# Patient Record
Sex: Female | Born: 1985 | Race: White | Hispanic: No | Marital: Married | Smoking: Never smoker
Health system: Southern US, Community
[De-identification: ages and names within clinical notes are randomized; demographics above are authoritative.]

## PROBLEM LIST (undated history)

## (undated) DIAGNOSIS — Z87442 Personal history of urinary calculi: Secondary | ICD-10-CM

## (undated) DIAGNOSIS — R51 Headache: Secondary | ICD-10-CM

## (undated) DIAGNOSIS — K219 Gastro-esophageal reflux disease without esophagitis: Secondary | ICD-10-CM

## (undated) DIAGNOSIS — R519 Headache, unspecified: Secondary | ICD-10-CM

## (undated) HISTORY — PX: WRIST SURGERY: SHX841

## (undated) HISTORY — PX: CHOLECYSTECTOMY: SHX55

## (undated) HISTORY — PX: TUBAL LIGATION: SHX77

## (undated) HISTORY — PX: WISDOM TOOTH EXTRACTION: SHX21

---
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
## 2009-11-05 ENCOUNTER — Inpatient Hospital Stay (HOSPITAL_COMMUNITY)
Admission: AD | Admit: 2009-11-05 | Discharge: 2009-11-09 | Payer: Self-pay | Source: Home / Self Care | Admitting: Obstetrics

## 2010-02-27 ENCOUNTER — Encounter
Admission: RE | Admit: 2010-02-27 | Discharge: 2010-02-27 | Payer: Self-pay | Source: Home / Self Care | Attending: Family Medicine | Admitting: Family Medicine

## 2010-03-07 ENCOUNTER — Encounter (HOSPITAL_COMMUNITY)
Admission: RE | Admit: 2010-03-07 | Discharge: 2010-04-03 | Payer: Self-pay | Source: Home / Self Care | Attending: Family Medicine | Admitting: Family Medicine

## 2010-03-16 ENCOUNTER — Ambulatory Visit (HOSPITAL_COMMUNITY): Admission: RE | Admit: 2010-03-16 | Payer: Self-pay | Source: Home / Self Care | Admitting: Gastroenterology

## 2010-03-16 ENCOUNTER — Ambulatory Visit (HOSPITAL_COMMUNITY)
Admission: RE | Admit: 2010-03-16 | Discharge: 2010-03-16 | Payer: Self-pay | Source: Home / Self Care | Attending: Gastroenterology | Admitting: Gastroenterology

## 2010-03-29 ENCOUNTER — Encounter
Admission: RE | Admit: 2010-03-29 | Discharge: 2010-03-29 | Payer: Self-pay | Source: Home / Self Care | Attending: General Surgery | Admitting: General Surgery

## 2010-04-04 ENCOUNTER — Encounter: Payer: Self-pay | Admitting: Family Medicine

## 2010-04-27 ENCOUNTER — Other Ambulatory Visit: Payer: Self-pay | Admitting: General Surgery

## 2010-04-27 ENCOUNTER — Encounter (HOSPITAL_COMMUNITY): Payer: 59

## 2010-04-27 LAB — CBC
HCT: 41.1 % (ref 36.0–46.0)
Hemoglobin: 13.1 g/dL (ref 12.0–15.0)
MCH: 28.7 pg (ref 26.0–34.0)
MCHC: 31.9 g/dL (ref 30.0–36.0)
MCV: 90.1 fL (ref 78.0–100.0)
Platelets: 264 10*3/uL (ref 150–400)
RBC: 4.56 MIL/uL (ref 3.87–5.11)
RDW: 12.6 % (ref 11.5–15.5)
WBC: 8.6 10*3/uL (ref 4.0–10.5)

## 2010-04-27 LAB — HCG, SERUM, QUALITATIVE: Preg, Serum: NEGATIVE

## 2010-04-27 LAB — SURGICAL PCR SCREEN: Staphylococcus aureus: POSITIVE — AB

## 2010-05-01 ENCOUNTER — Other Ambulatory Visit: Payer: Self-pay | Admitting: General Surgery

## 2010-05-01 ENCOUNTER — Ambulatory Visit (HOSPITAL_COMMUNITY)
Admission: RE | Admit: 2010-05-01 | Discharge: 2010-05-01 | Disposition: A | Discharge status: Home / Self Care | Payer: 59 | Source: Ambulatory Visit | Attending: General Surgery | Admitting: General Surgery

## 2010-05-01 ENCOUNTER — Ambulatory Visit (HOSPITAL_COMMUNITY): Payer: 59

## 2010-05-01 DIAGNOSIS — E669 Obesity, unspecified: Secondary | ICD-10-CM | POA: Insufficient documentation

## 2010-05-01 DIAGNOSIS — R109 Unspecified abdominal pain: Secondary | ICD-10-CM

## 2010-05-01 DIAGNOSIS — K811 Chronic cholecystitis: Secondary | ICD-10-CM | POA: Insufficient documentation

## 2010-05-03 NOTE — Op Note (Signed)
NAMEHAVANNA, GRONER NO.:  [id number]  MEDICAL RECORD NO.:  [id number]           PATIENT TYPE:  O  LOCATION:  DAYL                         FACILITY:  [facility]  PHYSICIAN:  Adolph Pollack, M.D.DATE OF BIRTH:  1985/06/26  DATE OF PROCEDURE:  05/01/2010 DATE OF DISCHARGE:                              OPERATIVE REPORT   PREOPERATIVE DIAGNOSIS:  Chronic cholecystitis.  POSTOPERATIVE DIAGNOSIS:  Chronic cholecystitis.  PROCEDURE:  Laparoscopic cholecystectomy with intraoperative cholangiogram.  SURGEON:  Adolph Pollack, M.D.  ASSISTANT:  Sandria Bales. Ezzard Standing, M.D.  ANESTHESIA:  General.  INDICATIONS:  Ms. Cibrian is a 25 year old female who is postpartum. Two weeks after she had her cesarean section, she began noticing sharp right upper quadrant pain radiating through to the back made worse by meals.  There was intermittent nausea and vomiting associated with these events, and they have been increasing in frequency.  She underwent a thorough evaluation including an abdominal ultrasound which did not show any gallbladder disease and a nuclear medicine hepatobiliary scan showed normal gallbladder function.  She had upper endoscopy which was also unrevealing.  The CT scan of the abdomen and pelvis was unrevealing as well, and she tried proton pump inhibitors and antispasmodics without any success.  Subsequently, she elected to undergo a laparoscopic cholecystectomy.  We discussed the procedure risks including possibility of this not alleviating her symptoms.  TECHNIQUE:  She was seen in the holding area and then brought to the operating room, placed supine on the operating table and general anesthetic was administered.  The abdominal wall was sterilely prepped and draped.  Marcaine solution was infiltrated in the subumbilical region.  A small subumbilical incision was made through the skin and subcutaneous tissue.  I made an incision through the anterior  and posterior fascial layers and entered the peritoneal cavity.  A pursestring suture of 0 Vicryl was placed around the fascial edges.  A Hasson trocar was introduced into the peritoneal cavity.  The pneumoperitoneum was created by insufflation of CO2 gas.  Next, a laparoscope was introduced.  There is no underlying bleeding or organ injury.  She was then placed in the reverse Trendelenburg position with the right side tilted slightly up.  A 5-mm trocar was placed through a small epigastric incision and two 5 mm trocars were placed in the right upper quadrant.  Gallbladder was visualized and was pale and slight red consistent with some chronic inflammatory changes.  There were adhesions between the omentum and duodenum and the gallbladder and these were taken down bluntly.  The fundus of the gallbladder was grasped and retracted toward the right shoulder.  The infundibulum was grasped and then with careful dissection on the infundibulum, it was mobilized and retracted laterally.  The cystic duct was identified close to the gallbladder, and a window was created around it.  A window was also created around the cystic artery.  The critical view was achieved. A clip was placed at the cystic duct gallbladder junction.  A small incision was made in the cystic duct and some sludge was milked  from it.  Cholangiocatheter was then passed through the anterior abdominal  wall and placed into the cystic duct, and a cholangiogram was performed.  Under real time fluoroscopy, dilute contrast was injected to the cystic duct which was of moderate to long length.  Common hepatic, right left hepatic, and common bile ducts all filled promptly.  Contrast drained promptly into the duodenum without obvious evidence of obstruction. Final report is pending the radiologist interpretation.  Following this, the cholangiocatheter was removed.  The cystic duct was clipped 3 times on the biliary side and then  divided.  The cystic artery was then clipped and divided.  Using electrocautery and some blunt dissection, the gallbladder was freed up from its liver attachments. There was some leakage of bile from the gallbladder.  Once the gallbladder was freed from the liver, it was placed in the Endopouch bag.  The Endopouch bag was then removed through the subumbilical port, and the subumbilical trocar was replaced.  Following this, I copiously irrigated out the gallbladder fossa and controlled bleeding with electrocautery.  I then copiously irrigated out the perihepatic area and evacuated the fluid.  I continued irrigation until the fluid was clear.  Further inspection of the gallbladder fossa demonstrated that hemostasis was adequate and there is no evidence of bile leak.  Following this, I removed the subumbilical trocar and closed the fascial defect under laparoscopic vision by tightening up and tying down the pursestring 0 Vicryl suture.  The remaining trocars were removed and pneumoperitoneum was released.  Skin incisions were closed with 4-0 Monocryl subcuticular stitches.  Steri-Strips and sterile dressings were applied.  She tolerated the procedure without any apparent complications and was taken to recovery room in satisfactory condition.     Adolph Pollack, M.D.     Kari Baars  D:  05/01/2010  T:  05/01/2010  Job:  [id number]  cc:   Anselmo Rod, MD, [facility] Fax: [phone]  Electronically Signed by Avel Peace M.D. on 05/03/2010 10:05:06 AM

## 2010-05-17 LAB — CBC
HCT: 32.9 % — ABNORMAL LOW (ref 36.0–46.0)
HCT: 38.5 % (ref 36.0–46.0)
Hemoglobin: 11.2 g/dL — ABNORMAL LOW (ref 12.0–15.0)
Hemoglobin: 12.9 g/dL (ref 12.0–15.0)
MCH: 30.9 pg (ref 26.0–34.0)
MCH: 31.4 pg (ref 26.0–34.0)
MCHC: 33.5 g/dL (ref 30.0–36.0)
MCHC: 34.2 g/dL (ref 30.0–36.0)
MCV: 92 fL (ref 78.0–100.0)
MCV: 92.3 fL (ref 78.0–100.0)
Platelets: 214 10*3/uL (ref 150–400)
Platelets: 277 10*3/uL (ref 150–400)
RBC: 3.58 MIL/uL — ABNORMAL LOW (ref 3.87–5.11)
RBC: 4.17 MIL/uL (ref 3.87–5.11)
RDW: 13.8 % (ref 11.5–15.5)
RDW: 13.9 % (ref 11.5–15.5)
WBC: 13.7 10*3/uL — ABNORMAL HIGH (ref 4.0–10.5)
WBC: 18.2 10*3/uL — ABNORMAL HIGH (ref 4.0–10.5)

## 2010-05-17 LAB — TYPE AND SCREEN
ABO/RH(D): A NEG
Antibody Screen: NEGATIVE

## 2010-05-17 LAB — ABO/RH: ABO/RH(D): A NEG

## 2010-05-17 LAB — RPR: RPR Ser Ql: NONREACTIVE

## 2010-07-17 NOTE — Op Note (Signed)
Grace Moore, Grace Moore              ACCOUNT NO.:  [id number]   MEDICAL RECORD NO.:  [id number]          PATIENT TYPE:  AMB   LOCATION:  DSC                          FACILITY:  [facility]   PHYSICIAN:  Feliberto Gottron. Turner Daniels, M.D.   DATE OF BIRTH:  1985/12/21   DATE OF PROCEDURE:  10/01/2006  DATE OF DISCHARGE:                               OPERATIVE REPORT   PREOPERATIVE DIAGNOSIS:  Right de Quervain's stenosing tenosynovitis.   POSTOPERATIVE DIAGNOSIS:  Right de Quervain's stenosing tenosynovitis.   PROCEDURE:  Right de Quervain's stenosing tenosynovitis release.   SURGEON:  Feliberto Gottron. Turner Daniels, M.D.   FIRST ASSISTANT:  None.   ANESTHETIC:  General LMA.   ESTIMATED BLOOD LOSS:  Minimal.   FLUID REPLACEMENT:  500 mL crystalloid.   DRAINS PLACED:  None.   TOURNIQUET TIME:  12 minutes.   INDICATIONS FOR PROCEDURE:  A 25 year old woman who underwent a left de  Quervain's release by me a few weeks ago, has done very well and desires  same on the right side where she has had de Quervain's disease for a  number of months now that has been refractory to conservative treatment.  Risks and benefits of surgery well-known to the patient, questions  answered.   DESCRIPTION OF PROCEDURE:  The patient identified by armband, taken to  the operating room at [facility] day surgery center where the appropriate  anesthetic monitors were attached and general LMA anesthesia induced  with the patient in supine position.  Tourniquet applied high to the  right forearm and right upper extremity prepped and draped in usual  sterile fashion from the fingertips to the tourniquet.  The limb was  wrapped in Esmarch bandage, tourniquet inflated to 300 mmHg and we made  a longitudinal incision over the first dorsal compartment of the right  wrist just through the skin and into the subcutaneous tissue.  Small  bleeders were identified and cauterized with bipolar.  A major branch of  the superficial radial nerve was identified  and gently retracted  dorsally and this exposed the first dorsal compartment tendon sheath.  This was incised and a rectangular section removed, freeing the abductor  pollicis longus tendon.  The EPL tendon was not seen.  It was in a  second sheath behind the APL and this was also opened, releasing the  extensor pollicis brevis tendon.  Satisfied with release, the wound was  then irrigated  out with normal saline solution.  We looked again for any small bleeders  and then the skin only was closed with running 5-0 nylon suture.  A  dressing of Xeroform, 4x4 dressing sponges, Webril and Ace wrap applied.  Tourniquet let down.  The patient awakened and taken to recovery room in  a sling.      Feliberto Gottron. Turner Daniels, M.D.  Electronically Signed     FJR/MEDQ  D:  10/01/2006  T:  10/02/2006  Job:  [id number]

## 2010-07-20 NOTE — Op Note (Signed)
NAMEMANILA, ROMMEL              ACCOUNT NO.:  [id number]   MEDICAL RECORD NO.:  [id number]          PATIENT TYPE:  AMB   LOCATION:  DSC                          FACILITY:  [facility]   PHYSICIAN:  Feliberto Gottron. Turner Daniels, M.D.   DATE OF BIRTH:  Oct 09, 1985   DATE OF PROCEDURE:  07/14/2006  DATE OF DISCHARGE:  07/14/2006                               OPERATIVE REPORT   PREOPERATIVE DIAGNOSIS:  Left wrist de Quervain's stenosing  tenosynovitis.   POSTOPERATIVE DIAGNOSIS:  Left wrist de Quervain's stenosing  tenosynovitis.   PROCEDURE:  Left wrist de Quervain's release.   SURGEON:  Feliberto Gottron. Turner Daniels, M.D.   FIRST ASSISTANT:  None.   ANESTHETIC:  General LMA.   ESTIMATED BLOOD LOSS:  Minimal.   FLUID REPLACEMENT:  500 mL of crystalloid.   DRAINS PLACED:  None.   TOURNIQUET TIME:  Fifteen minutes.   INDICATIONS FOR PROCEDURE:  [age]-year-old young woman with  symptomatic left wrist de Quervain's stenosing tenosynovitis previously  treated by one of my partners, Dr. Althea Charon, who now presents for  surgical decompression of same, having failed conservative measures.  Risks and benefits of surgery were discussed, questions answered.   DESCRIPTION OF PROCEDURE:  The patient was identified by armband and  taken to the operating room at [facility].  Appropriate  site monitors were attached and general LMA anesthesia induced with the  patient in supine position.  Tourniquet was then applied to the forearm  and the left upper extremity prepped and draped in usual sterile fashion  from the fingertips to the tourniquet.  We began the procedure by making  a 1-cm incision, longitudinal, over the 1st dorsal compartment of the  left wrist through the skin and just into the subcutaneous tissue,  spread with tenotomy scissors and identified the 1st dorsal compartment.  A rectangular section of the pulley was removed, exposing the abductor  pollicis longus tendon, which was freed, and the  abductor pollicis  longus tendon and the extensor pollicis brevis tendon.  Both tendons  were freed up.  The wound was irrigated out with normal  saline solution and then carefully closed with a running 5-0 nylon  suture.  A dressing of Xeroform, 4 x 4 dressing sponges x2, two-inch  Webril and a 2-inch Ace wrap were then applied.  The tourniquet was let  down, the patient awakened and taken to the recovery room without  difficulty.      Feliberto Gottron. Turner Daniels, M.D.  Electronically Signed     FJR/MEDQ  D:  08/11/2006  T:  08/12/2006  Job:  [id number]

## 2010-07-20 NOTE — Op Note (Signed)
NAME:  Grace Moore, Grace Moore                        ACCOUNT NO.:  [id number]   MEDICAL RECORD NO.:  [id number]                   PATIENT TYPE:  AMB   LOCATION:  ENDO                                 FACILITY:  [facility]   PHYSICIAN:  James L. Malon Kindle., M.D.          DATE OF BIRTH:  06/28/1985   DATE OF PROCEDURE:  03/07/2003  DATE OF DISCHARGE:                                 OPERATIVE REPORT   PROCEDURE PERFORMED:  Esophagogastroduodenoscopy.   MEDICATIONS:  Fentanyl 50 mcg, Versed 7 mg IV, Cetacaine spray.   ENDOSCOPIST:  Llana Aliment. Randa Evens, M.D.   INDICATIONS FOR PROCEDURE:  Persistent dyspepsia.   DESCRIPTION OF PROCEDURE:  The procedure had been explained to the patient  and consent obtained.  With the patient in left lateral decubitus position,  the Olympus scope was inserted and advanced under direct visualization.  The  stomach was entered.  The pylorus identified and passed.  The duodenum  including the bulb and second portion were seen well and were unremarkable.  The scope was withdrawn.  The duodenum, pyloric channel and antrum were  normal.  The fundus and cardia were seen well on the retroflex view and were  normal.  The gastroesophageal junction was widely patent with a slightly  reddened esophagus and a small hiatal hernia, no evidence of Barrett's  esophagus.  The proximal esophagus was seen well and was normal.  The scope  was withdrawn.  The patient tolerated the procedure well.   ASSESSMENT:  Gastroesophageal reflux disease.  Not clear if this is causing  all of her problems.   PLAN:  Will give her reflux sheet, continue on the Aciphex twice daily.  See  back in the office in six to eight weeks.  Will go ahead and give her a  trial of Robinul to see if irritable bowel may be a factor in this.                                               James L. Malon Kindle., M.D.    Waldron Session  D:  03/07/2003  T:  03/07/2003  Job:  [id number]   cc:   Meredith Staggers, M.D.  [address]  [place]  [place] [address]  Fax: [phone]

## 2010-10-23 ENCOUNTER — Other Ambulatory Visit (HOSPITAL_COMMUNITY): Payer: Self-pay | Admitting: Family Medicine

## 2010-10-23 DIAGNOSIS — R609 Edema, unspecified: Secondary | ICD-10-CM

## 2010-10-23 DIAGNOSIS — R52 Pain, unspecified: Secondary | ICD-10-CM

## 2010-11-06 ENCOUNTER — Ambulatory Visit (HOSPITAL_COMMUNITY): Payer: Commercial Managed Care - PPO

## 2010-11-07 ENCOUNTER — Ambulatory Visit (HOSPITAL_COMMUNITY)
Admission: RE | Admit: 2010-11-07 | Discharge: 2010-11-07 | Disposition: A | Discharge status: Home / Self Care | Payer: 59 | Source: Ambulatory Visit | Attending: Family Medicine | Admitting: Family Medicine

## 2010-11-07 DIAGNOSIS — M65979 Unspecified synovitis and tenosynovitis, unspecified ankle and foot: Secondary | ICD-10-CM | POA: Insufficient documentation

## 2010-11-07 DIAGNOSIS — M25476 Effusion, unspecified foot: Secondary | ICD-10-CM | POA: Insufficient documentation

## 2010-11-07 DIAGNOSIS — R609 Edema, unspecified: Secondary | ICD-10-CM

## 2010-11-07 DIAGNOSIS — M25473 Effusion, unspecified ankle: Secondary | ICD-10-CM | POA: Insufficient documentation

## 2010-11-07 DIAGNOSIS — M25579 Pain in unspecified ankle and joints of unspecified foot: Secondary | ICD-10-CM | POA: Insufficient documentation

## 2010-11-07 DIAGNOSIS — R52 Pain, unspecified: Secondary | ICD-10-CM

## 2010-11-07 DIAGNOSIS — M659 Synovitis and tenosynovitis, unspecified: Secondary | ICD-10-CM | POA: Insufficient documentation

## 2010-12-17 LAB — POCT HEMOGLOBIN-HEMACUE
Hemoglobin: 14.2
Operator id: 116011

## 2011-07-15 IMAGING — CT CT ABD-PELV W/ CM
3 of 4 series · 13 of 36 positions shown, 19 images · IV contrast (OMNI 300, WATER & [ID] OMNI 300)
Comparison: none

CLINICAL DATA: Right upper quadrant abdominal pain

CT ABDOMEN AND PELVIS WITH CONTRAST
TECHNIQUE: Multidetector CT imaging of the abdomen and pelvis was
performed following the standard protocol during bolus
administration of intravenous contrast.
Contrast: 125ml omni 300

[Series 3: routine abdomen · axial · 0.78mm/px · z∈[-310,+20]mm · 8 of 86 slices shown, 13 images]
[im 10/86  soft-tissue]
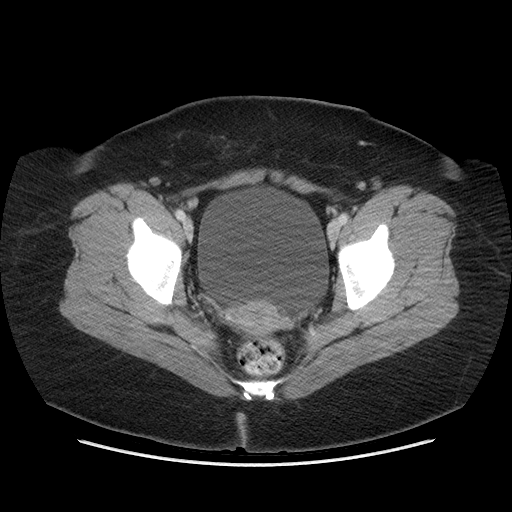
[im 10/86  bone]
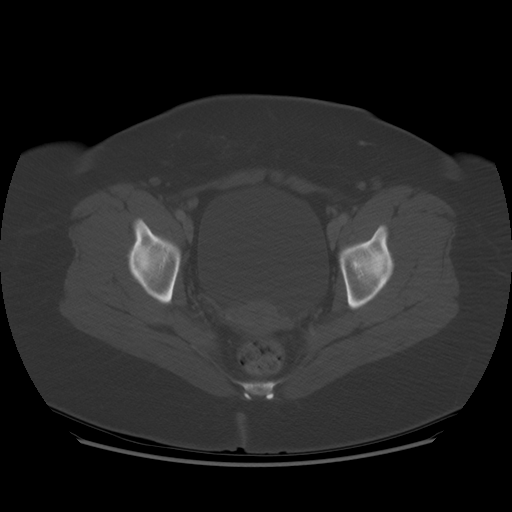
[im 19/86  soft-tissue]
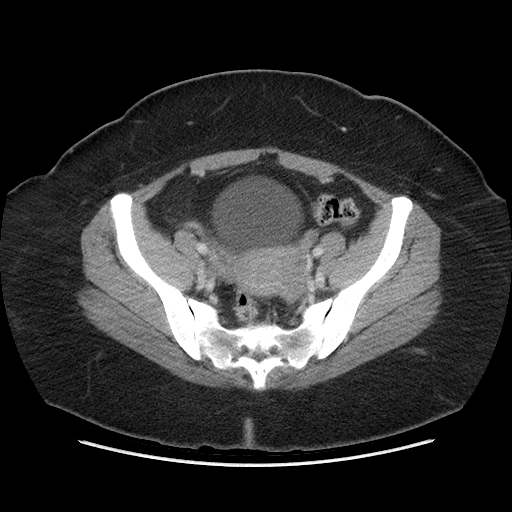
[im 29/86  soft-tissue]
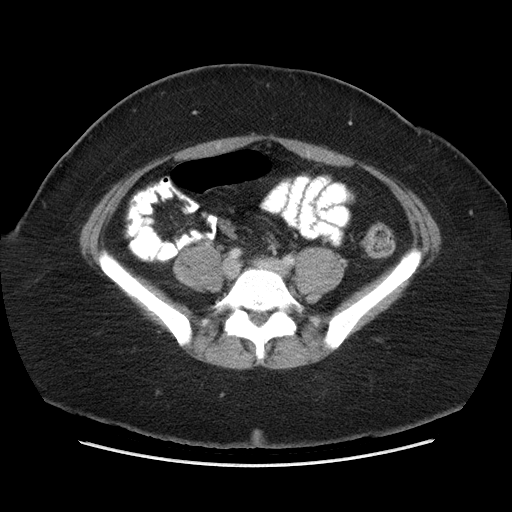
[im 38/86  soft-tissue]
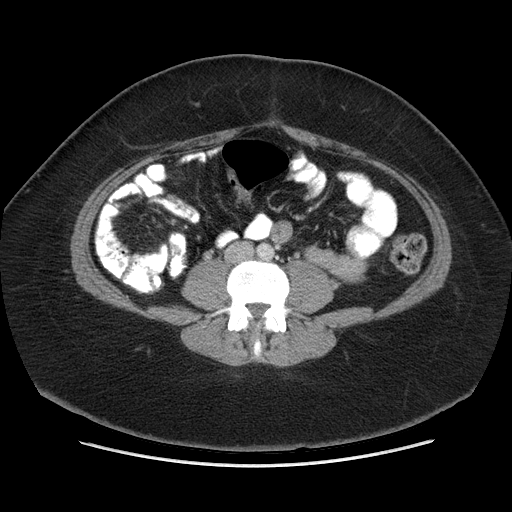
[im 48/86  soft-tissue]
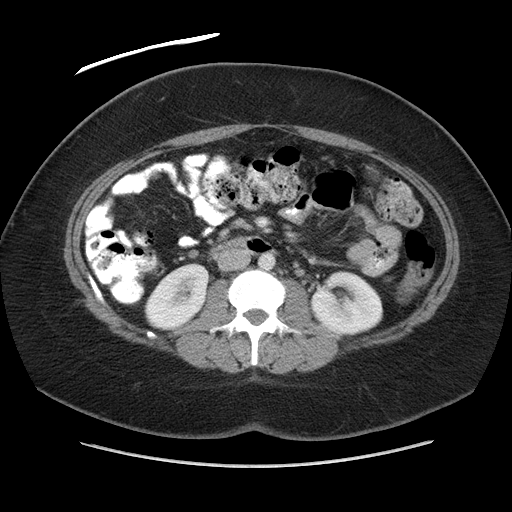
[im 48/86  lung]
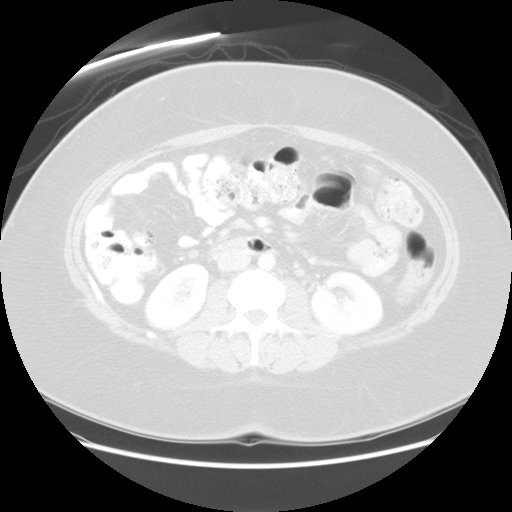
[im 57/86  soft-tissue]
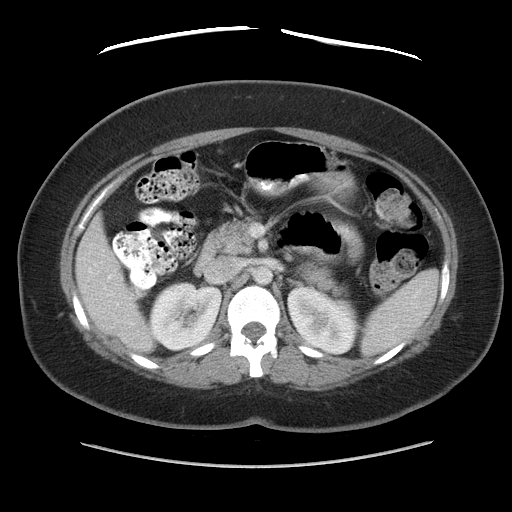
[im 57/86  lung]
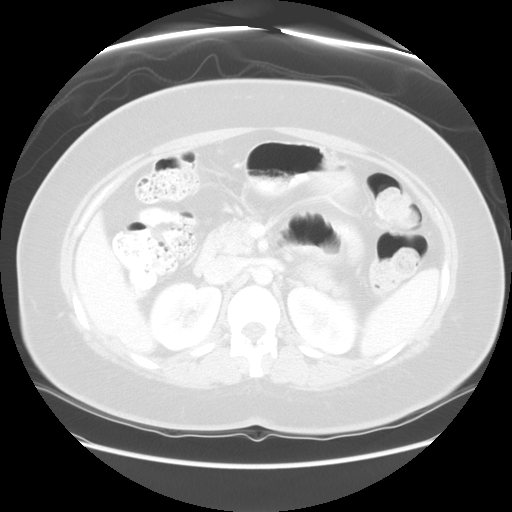
[im 67/86  soft-tissue]
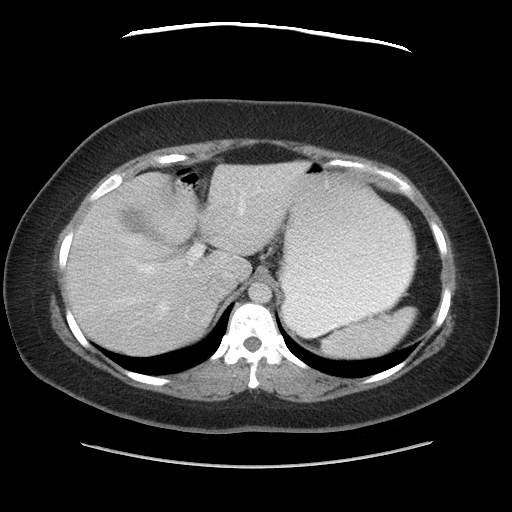
[im 67/86  lung]
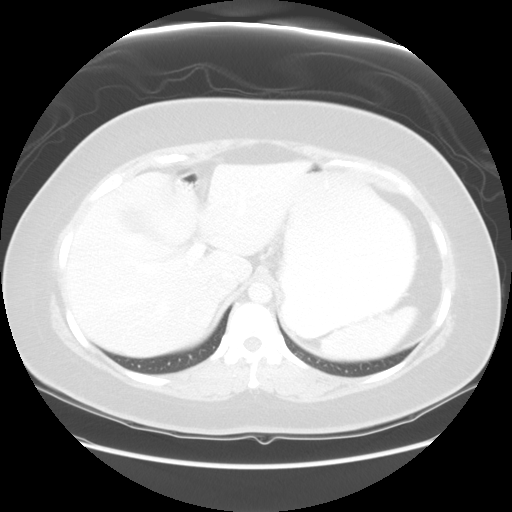
[im 76/86  soft-tissue]
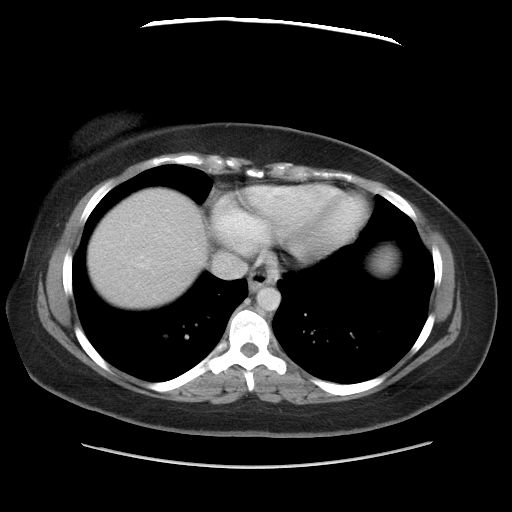
[im 76/86  lung]
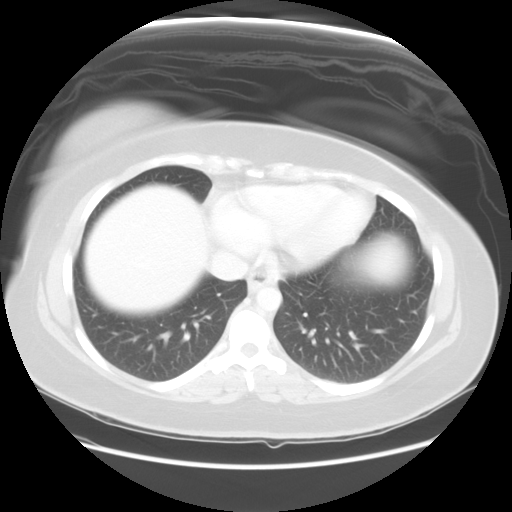

[Series 601: coronal body · coronal · 0.94mm/px · 1 of 127 slices shown, 2 images]
[im 43/127  soft-tissue]
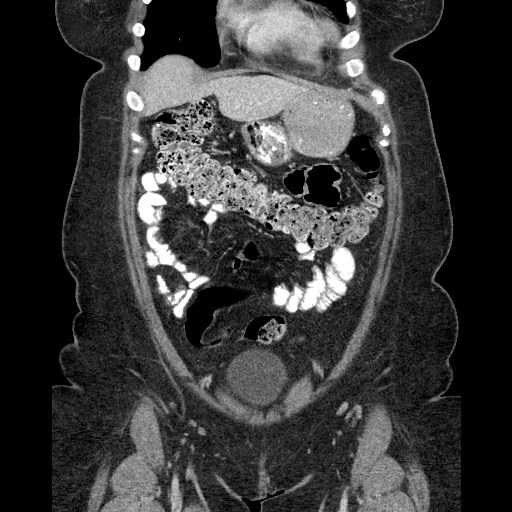
[im 43/127  bone]
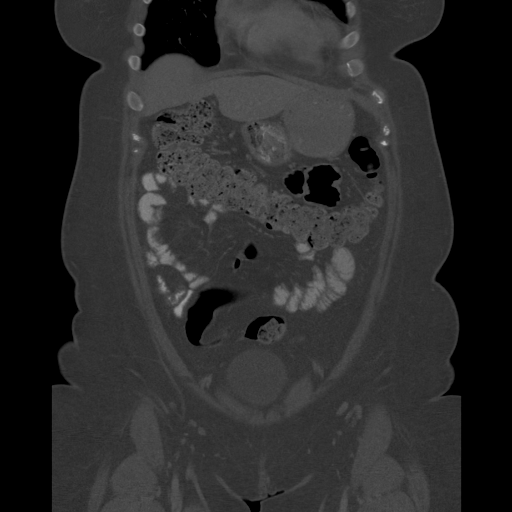

[Series 602: sagittal body · sagittal · 0.94mm/px · 4 of 161 slices shown]
[im 18/161  soft-tissue]
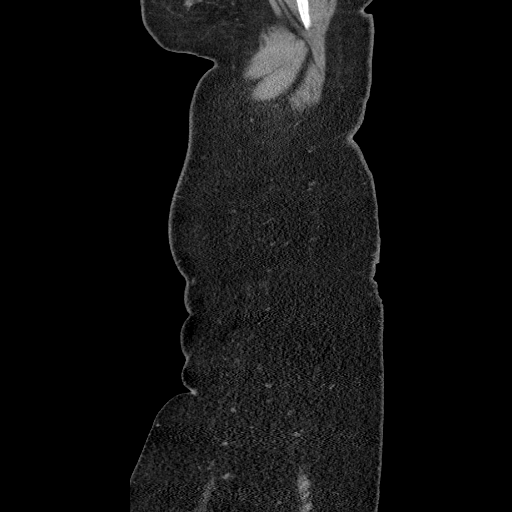
[im 36/161  soft-tissue]
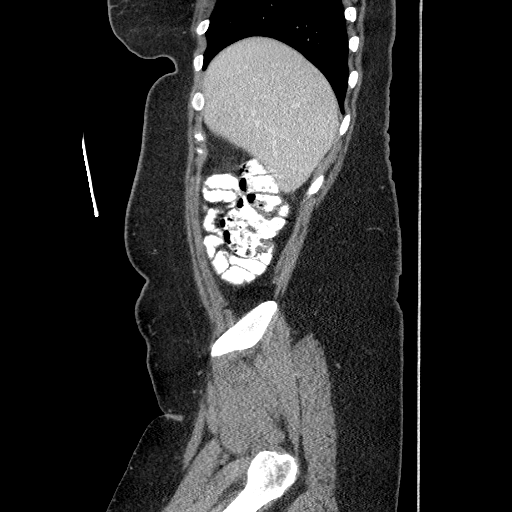
[im 54/161  soft-tissue]
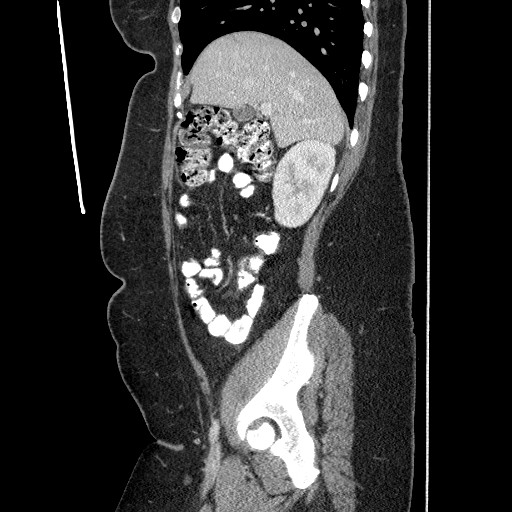
[im 72/161  soft-tissue]
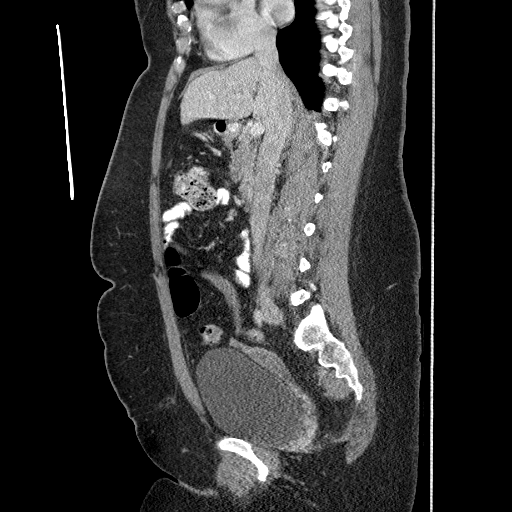

[13 of 36 positions shown; findings below may reference images not displayed]

FINDINGS: The lung bases appear clear.  The spleen appears normal.

Both adrenal glands appear normal.

The pancreas appears normal.

No focal liver abnormalities identified.

The gallbladder is unremarkable.  No biliary dilatation.

Normal kidneys.

There are no enlarged lymph nodes within the upper abdomen or
pelvis.

No inguinal adenopathy.  The urinary bladder appears normal.

There is an IUD within the uterine cavity.  The uterus and adnexal
structures have an otherwise physiologic appearance

The stomach and the small bowel loops have a normal course and
caliber.

The colon appears normal.

The appendix is negative.

Review of the visualized osseous structures is unremarkable.
IMPRESSION: 1.  No acute findings within the abdomen or pelvis.

## 2012-06-02 ENCOUNTER — Ambulatory Visit (INDEPENDENT_AMBULATORY_CARE_PROVIDER_SITE_OTHER): Payer: 59 | Admitting: Family Medicine

## 2012-06-02 DIAGNOSIS — Z3049 Encounter for surveillance of other contraceptives: Secondary | ICD-10-CM

## 2012-06-02 DIAGNOSIS — Z3042 Encounter for surveillance of injectable contraceptive: Secondary | ICD-10-CM

## 2012-06-02 MED ORDER — MEDROXYPROGESTERONE ACETATE 150 MG/ML IM SUSP
150.0000 mg | Freq: Once | INTRAMUSCULAR | Status: AC
  Administered 2012-06-02: 150 mg via INTRAMUSCULAR

## 2012-08-26 ENCOUNTER — Ambulatory Visit (INDEPENDENT_AMBULATORY_CARE_PROVIDER_SITE_OTHER): Payer: 59 | Admitting: Family Medicine

## 2012-08-26 DIAGNOSIS — Z309 Encounter for contraceptive management, unspecified: Secondary | ICD-10-CM

## 2012-08-26 MED ORDER — MEDROXYPROGESTERONE ACETATE 150 MG/ML IM SUSP
150.0000 mg | Freq: Once | INTRAMUSCULAR | Status: AC
  Administered 2012-08-26: 150 mg via INTRAMUSCULAR

## 2012-08-31 ENCOUNTER — Encounter: Payer: Self-pay | Admitting: Physician Assistant

## 2012-11-06 ENCOUNTER — Ambulatory Visit: Payer: 59 | Admitting: Family Medicine

## 2012-11-06 DIAGNOSIS — Z23 Encounter for immunization: Secondary | ICD-10-CM

## 2012-11-13 ENCOUNTER — Other Ambulatory Visit: Payer: Self-pay | Admitting: Family Medicine

## 2012-11-13 ENCOUNTER — Other Ambulatory Visit: Payer: 59

## 2012-11-13 DIAGNOSIS — E559 Vitamin D deficiency, unspecified: Secondary | ICD-10-CM

## 2012-11-14 LAB — VITAMIN D 25 HYDROXY (VIT D DEFICIENCY, FRACTURES): Vit D, 25-Hydroxy: 55 ng/mL (ref 30–89)

## 2012-11-20 ENCOUNTER — Ambulatory Visit (INDEPENDENT_AMBULATORY_CARE_PROVIDER_SITE_OTHER): Payer: 59 | Admitting: *Deleted

## 2012-11-20 DIAGNOSIS — Z3042 Encounter for surveillance of injectable contraceptive: Secondary | ICD-10-CM

## 2012-11-20 DIAGNOSIS — Z3049 Encounter for surveillance of other contraceptives: Secondary | ICD-10-CM

## 2012-11-20 MED ORDER — MEDROXYPROGESTERONE ACETATE 150 MG/ML IM SUSP
150.0000 mg | Freq: Once | INTRAMUSCULAR | Status: AC
  Administered 2012-11-20: 150 mg via INTRAMUSCULAR

## 2012-11-30 ENCOUNTER — Encounter: Payer: Self-pay | Admitting: Physician Assistant

## 2012-11-30 ENCOUNTER — Ambulatory Visit (INDEPENDENT_AMBULATORY_CARE_PROVIDER_SITE_OTHER): Payer: 59 | Admitting: Physician Assistant

## 2012-11-30 VITALS — BP 116/80 | HR 92 | Temp 98.8°F | Resp 18

## 2012-11-30 DIAGNOSIS — A499 Bacterial infection, unspecified: Secondary | ICD-10-CM

## 2012-11-30 DIAGNOSIS — B9689 Other specified bacterial agents as the cause of diseases classified elsewhere: Secondary | ICD-10-CM

## 2012-11-30 DIAGNOSIS — J069 Acute upper respiratory infection, unspecified: Secondary | ICD-10-CM

## 2012-11-30 MED ORDER — AMOXICILLIN-POT CLAVULANATE 875-125 MG PO TABS: 1.0000 | ORAL_TABLET | Freq: Two times a day (BID) | ORAL | Status: DC

## 2012-11-30 NOTE — Progress Notes (Signed)
   Patient ID: SPENCER PETERKIN MRN: [id number], DOB: 26-Nov-1985, 27 y.o. Date of Encounter: 11/30/2012, 4:06 PM    Chief Complaint:  Chief Complaint  Patient presents with  . c/o congestion,cough, sneezing     HPI: 27 y.o. year old white female reports that she has been sick for about 2 weeks. She has had a lot of head and nasal congestion and sinus pain and pressure. Also mucus from her nose. Her throat was sore in the beginning but has not been very sore recently. However she has had some hoarseness off and on. No chest congestion. Has multiple family members have been sick. Her daughter has ear infections.  Home Meds: See attached medication section for any medications that were entered at today's visit. The computer does not put those onto this list.The following list is a list of meds entered prior to today's visit.   No current outpatient prescriptions on file prior to visit.   No current facility-administered medications on file prior to visit.    Allergies: No Known Allergies    Review of Systems: See HPI for pertinent ROS. All other ROS negative.    Physical Exam: Blood pressure 116/80, pulse 92, temperature 98.8 F (37.1 C), temperature source Oral, resp. rate 18., There is no height or weight on file to calculate BMI. General:  Appears in no acute distress. HEENT: Normocephalic, atraumatic, eyes without discharge, sclera non-icteric, nares are without discharge. Bilateral auditory canals clear, TM's are without perforation, pearly grey and translucent with reflective cone of light bilaterally. Oral cavity moist, posterior pharynx without exudate, erythema, peritonsillar abscess. Bilateral maxillary sinuses are tender.  Neck: Supple. No thyromegaly. No lymphadenopathy. Lungs: Clear bilaterally to auscultation without wheezes, rales, or rhonchi. Breathing is unlabored. Heart: Regular rhythm. No murmurs, rubs, or gallops. Msk:  Strength and tone normal for  age. Extremities/Skin: Warm and dry. Neuro: Alert and oriented X 3. Moves all extremities spontaneously. Gait is normal. CNII-XII grossly in tact. Psych:  Responds to questions appropriately with a normal affect.     ASSESSMENT AND PLAN:  27 y.o. year old female with  1. Bacterial upper respiratory infection - amoxicillin-clavulanate (AUGMENTIN) 875-125 MG per tablet; Take 1 tablet by mouth 2 (two) times daily.  Dispense: 20 tablet; Refill: 0 Also can use over-the-counter decongestants as needed. Followup if does not resolve with completion of antibiotics.  [address] [place], [place], [facility] 11/30/2012 4:06 PM

## 2013-09-26 ENCOUNTER — Encounter (HOSPITAL_BASED_OUTPATIENT_CLINIC_OR_DEPARTMENT_OTHER): Payer: Self-pay | Admitting: Emergency Medicine

## 2013-09-26 ENCOUNTER — Emergency Department (HOSPITAL_BASED_OUTPATIENT_CLINIC_OR_DEPARTMENT_OTHER): Payer: 59

## 2013-09-26 ENCOUNTER — Emergency Department (HOSPITAL_BASED_OUTPATIENT_CLINIC_OR_DEPARTMENT_OTHER)
Admission: EM | Admit: 2013-09-26 | Discharge: 2013-09-26 | Disposition: A | Discharge status: Home / Self Care | Payer: 59 | Attending: Emergency Medicine | Admitting: Emergency Medicine

## 2013-09-26 DIAGNOSIS — K5900 Constipation, unspecified: Secondary | ICD-10-CM | POA: Diagnosis not present

## 2013-09-26 DIAGNOSIS — R109 Unspecified abdominal pain: Secondary | ICD-10-CM | POA: Insufficient documentation

## 2013-09-26 DIAGNOSIS — R300 Dysuria: Secondary | ICD-10-CM | POA: Insufficient documentation

## 2013-09-26 DIAGNOSIS — G8929 Other chronic pain: Secondary | ICD-10-CM | POA: Diagnosis not present

## 2013-09-26 DIAGNOSIS — Z3202 Encounter for pregnancy test, result negative: Secondary | ICD-10-CM | POA: Diagnosis not present

## 2013-09-26 DIAGNOSIS — M549 Dorsalgia, unspecified: Secondary | ICD-10-CM | POA: Diagnosis not present

## 2013-09-26 DIAGNOSIS — Z79899 Other long term (current) drug therapy: Secondary | ICD-10-CM | POA: Insufficient documentation

## 2013-09-26 LAB — CBC WITH DIFFERENTIAL/PLATELET
BASOS ABS: 0 10*3/uL (ref 0.0–0.1)
BASOS PCT: 0 % (ref 0–1)
Eosinophils Absolute: 0.1 10*3/uL (ref 0.0–0.7)
Eosinophils Relative: 1 % (ref 0–5)
HCT: 39.5 % (ref 36.0–46.0)
Hemoglobin: 13.1 g/dL (ref 12.0–15.0)
Lymphocytes Relative: 27 % (ref 12–46)
Lymphs Abs: 2.4 10*3/uL (ref 0.7–4.0)
MCH: 30.3 pg (ref 26.0–34.0)
MCHC: 33.2 g/dL (ref 30.0–36.0)
MCV: 91.4 fL (ref 78.0–100.0)
MONO ABS: 0.9 10*3/uL (ref 0.1–1.0)
Monocytes Relative: 10 % (ref 3–12)
NEUTROS ABS: 5.7 10*3/uL (ref 1.7–7.7)
Neutrophils Relative %: 63 % (ref 43–77)
PLATELETS: 277 10*3/uL (ref 150–400)
RBC: 4.32 MIL/uL (ref 3.87–5.11)
RDW: 12.7 % (ref 11.5–15.5)
WBC: 9.1 10*3/uL (ref 4.0–10.5)

## 2013-09-26 LAB — PREGNANCY, URINE: PREG TEST UR: NEGATIVE

## 2013-09-26 LAB — URINALYSIS, ROUTINE W REFLEX MICROSCOPIC
Bilirubin Urine: NEGATIVE
GLUCOSE, UA: NEGATIVE mg/dL
Hgb urine dipstick: NEGATIVE
KETONES UR: NEGATIVE mg/dL
LEUKOCYTES UA: NEGATIVE
NITRITE: NEGATIVE
PROTEIN: NEGATIVE mg/dL
Specific Gravity, Urine: 1.018 (ref 1.005–1.030)
UROBILINOGEN UA: 1 mg/dL (ref 0.0–1.0)
pH: 8 (ref 5.0–8.0)

## 2013-09-26 LAB — COMPREHENSIVE METABOLIC PANEL
ALBUMIN: 3.6 g/dL (ref 3.5–5.2)
ALT: 65 U/L — ABNORMAL HIGH (ref 0–35)
AST: 42 U/L — ABNORMAL HIGH (ref 0–37)
Alkaline Phosphatase: 76 U/L (ref 39–117)
Anion gap: 11 (ref 5–15)
BUN: 11 mg/dL (ref 6–23)
CHLORIDE: 104 meq/L (ref 96–112)
CO2: 26 mEq/L (ref 19–32)
CREATININE: 0.7 mg/dL (ref 0.50–1.10)
Calcium: 9.1 mg/dL (ref 8.4–10.5)
GFR calc Af Amer: >90 mL/min (ref >90)
GFR calc non Af Amer: >90 mL/min (ref >90)
Glucose, Bld: 103 mg/dL — ABNORMAL HIGH (ref 70–99)
Potassium: 4.1 mEq/L (ref 3.7–5.3)
Sodium: 141 mEq/L (ref 137–147)
Total Bilirubin: 0.5 mg/dL (ref 0.3–1.2)
Total Protein: 7.2 g/dL (ref 6.0–8.3)

## 2013-09-26 MED ORDER — MORPHINE SULFATE 4 MG/ML IJ SOLN
4.0000 mg | Freq: Once | INTRAMUSCULAR | Status: AC
  Administered 2013-09-26: 4 mg via INTRAVENOUS
  Filled 2013-09-26: qty 1

## 2013-09-26 MED ORDER — POLYETHYLENE GLYCOL 3350 17 G PO PACK: 17.0000 g | PACK | Freq: Every day | ORAL | Status: DC

## 2013-09-26 MED ORDER — FLEET ENEMA 7-19 GM/118ML RE ENEM: 1.0000 | ENEMA | Freq: Every day | RECTAL | Status: DC | PRN

## 2013-09-26 MED ORDER — SODIUM CHLORIDE 0.9 % IV SOLN
Freq: Once | INTRAVENOUS | Status: AC
  Administered 2013-09-26: 10 mL/h via INTRAVENOUS

## 2013-09-26 MED ORDER — MAGNESIUM CITRATE PO SOLN: 1.0000 | Freq: Once | ORAL | Status: DC

## 2013-09-26 MED ORDER — IOHEXOL 300 MG/ML  SOLN
100.0000 mL | Freq: Once | INTRAMUSCULAR | Status: AC | PRN
Start: 2013-09-26 — End: 2013-09-26
  Administered 2013-09-26: 100 mL via INTRAVENOUS

## 2013-09-26 MED ORDER — HYDROMORPHONE HCL PF 1 MG/ML IJ SOLN
1.0000 mg | Freq: Once | INTRAMUSCULAR | Status: AC
  Administered 2013-09-26: 1 mg via INTRAVENOUS
  Filled 2013-09-26: qty 1

## 2013-09-26 MED ORDER — ONDANSETRON HCL 4 MG/2ML IJ SOLN
4.0000 mg | Freq: Once | INTRAMUSCULAR | Status: AC
  Administered 2013-09-26: 4 mg via INTRAVENOUS
  Filled 2013-09-26: qty 2

## 2013-09-26 MED ORDER — IOHEXOL 300 MG/ML  SOLN
50.0000 mL | Freq: Once | INTRAMUSCULAR | Status: AC | PRN
  Administered 2013-09-26: 50 mL via INTRAVENOUS

## 2013-09-26 NOTE — ED Provider Notes (Signed)
CSN: [id number]     Arrival date & time 09/26/13  1914 History   First MD Initiated Contact with Patient 09/26/13 1933     Chief Complaint  Patient presents with  . Abdominal Pain     (Consider location/radiation/quality/duration/timing/severity/associated sxs/prior Treatment) Patient is a 28 y.o. female presenting with abdominal pain. The history is provided by the patient and a significant other.  Abdominal Pain Pain location:  RLQ Pain quality: cramping and sharp   Pain severity:  Moderate Associated symptoms: dysuria   Associated symptoms: no chest pain, no chills, no cough, no diarrhea, no fever, no nausea, no shortness of breath, no vaginal discharge and no vomiting   Associated symptoms comment:  Pain since last night located in right abdomen and right lower quadrant abdomen. No fever, N, V. No change to bowel movement but she reports worsening pain with BM and urination. No history of kidney stones.    History reviewed. No pertinent past medical history. Past Surgical History  Procedure Laterality Date  . Wrist surgery    . Cesarean section    . Cholecystectomy     History reviewed. No pertinent family history. History  Substance Use Topics  . Smoking status: Never Smoker   . Smokeless tobacco: Not on file  . Alcohol Use: No   OB History   Grav Para Term Preterm Abortions TAB SAB Ect Mult Living                 Review of Systems  Constitutional: Negative for fever and chills.  Respiratory: Negative.  Negative for cough and shortness of breath.   Cardiovascular: Negative.  Negative for chest pain.  Gastrointestinal: Positive for abdominal pain. Negative for nausea, vomiting and diarrhea.  Genitourinary: Positive for dysuria. Negative for frequency and vaginal discharge.  Musculoskeletal: Positive for back pain. Negative for myalgias.       Patient has chronic back pain.  Skin: Negative.   Neurological: Negative.       Allergies  Review of patient's  allergies indicates no known allergies.  Home Medications   Prior to Admission medications   Medication Sig Start Date End Date Taking? Authorizing Provider  buPROPion (WELLBUTRIN XL) 150 MG 24 hr tablet Take 150 mg by mouth daily.   Yes Historical Provider, MD  cholecalciferol (VITAMIN D) 1000 UNITS tablet Take 1,000 Units by mouth daily.   Yes Historical Provider, MD  amoxicillin-clavulanate (AUGMENTIN) 875-125 MG per tablet Take 1 tablet by mouth 2 (two) times daily. 11/30/12   Lonie Peak Dixon, PA-C  medroxyPROGESTERone (DEPO-PROVERA) 150 MG/ML injection Inject 150 mg into the muscle every 3 (three) months.    Historical Provider, MD   BP 153/78  Pulse 93  Temp(Src) 98.7 F (37.1 C) (Oral)  Resp 16  Ht 5\' 2"  (1.575 m)  Wt 212 lb (96.163 kg)  BMI 38.77 kg/m2  SpO2 100% Physical Exam  Constitutional: She is oriented to person, place, and time. She appears well-developed and well-nourished.  HENT:  Head: Normocephalic.  Neck: Normal range of motion. Neck supple.  Pulmonary/Chest: Effort normal.  Abdominal: Soft. There is tenderness. There is no rebound and no guarding.  Tender to palpation of right mid-abdomen and RLQ abdomen.  Musculoskeletal: Normal range of motion.  Neurological: She is alert and oriented to person, place, and time. No cranial nerve deficit.  Skin: Skin is warm and dry. No rash noted.  Psychiatric: She has a normal mood and affect.    ED Course  Procedures (including critical  care time) Labs Review Labs Reviewed  URINALYSIS, ROUTINE W REFLEX MICROSCOPIC - Abnormal; Notable for the following:    APPearance CLOUDY (*)    All other components within normal limits  PREGNANCY, URINE  CBC WITH DIFFERENTIAL  COMPREHENSIVE METABOLIC PANEL   Results for orders placed during the hospital encounter of 09/26/13  PREGNANCY, URINE      Result Value Ref Range   Preg Test, Ur NEGATIVE  NEGATIVE  URINALYSIS, ROUTINE W REFLEX MICROSCOPIC      Result Value Ref Range    Color, Urine YELLOW  YELLOW   APPearance CLOUDY (*) CLEAR   Specific Gravity, Urine 1.018  1.005 - 1.030   pH 8.0  5.0 - 8.0   Glucose, UA NEGATIVE  NEGATIVE mg/dL   Hgb urine dipstick NEGATIVE  NEGATIVE   Bilirubin Urine NEGATIVE  NEGATIVE   Ketones, ur NEGATIVE  NEGATIVE mg/dL   Protein, ur NEGATIVE  NEGATIVE mg/dL   Urobilinogen, UA 1.0  0.0 - 1.0 mg/dL   Nitrite NEGATIVE  NEGATIVE   Leukocytes, UA NEGATIVE  NEGATIVE  CBC WITH DIFFERENTIAL      Result Value Ref Range   WBC 9.1  4.0 - 10.5 K/uL   RBC 4.32  3.87 - 5.11 MIL/uL   Hemoglobin 13.1  12.0 - 15.0 g/dL   HCT 39.5  36.0 - 46.0 %   MCV 91.4  78.0 - 100.0 fL   MCH 30.3  26.0 - 34.0 pg   MCHC 33.2  30.0 - 36.0 g/dL   RDW 12.7  11.5 - 15.5 %   Platelets 277  150 - 400 K/uL   Neutrophils Relative % 63  43 - 77 %   Neutro Abs 5.7  1.7 - 7.7 K/uL   Lymphocytes Relative 27  12 - 46 %   Lymphs Abs 2.4  0.7 - 4.0 K/uL   Monocytes Relative 10  3 - 12 %   Monocytes Absolute 0.9  0.1 - 1.0 K/uL   Eosinophils Relative 1  0 - 5 %   Eosinophils Absolute 0.1  0.0 - 0.7 K/uL   Basophils Relative 0  0 - 1 %   Basophils Absolute 0.0  0.0 - 0.1 K/uL  COMPREHENSIVE METABOLIC PANEL      Result Value Ref Range   Sodium 141  137 - 147 mEq/L   Potassium 4.1  3.7 - 5.3 mEq/L   Chloride 104  96 - 112 mEq/L   CO2 26  19 - 32 mEq/L   Glucose, Bld 103 (*) 70 - 99 mg/dL   BUN 11  6 - 23 mg/dL   Creatinine, Ser 0.70  0.50 - 1.10 mg/dL   Calcium 9.1  8.4 - 10.5 mg/dL   Total Protein 7.2  6.0 - 8.3 g/dL   Albumin 3.6  3.5 - 5.2 g/dL   AST 42 (*) 0 - 37 U/L   ALT 65 (*) 0 - 35 U/L   Alkaline Phosphatase 76  39 - 117 U/L   Total Bilirubin 0.5  0.3 - 1.2 mg/dL   GFR calc non Af Amer >90  >90 mL/min   GFR calc Af Amer >90  >90 mL/min   Anion gap 11  5 - 15   Ct Abdomen Pelvis W Contrast  09/26/2013   CLINICAL DATA:  Chest pain, abdominal pain, patient concerned about urinary tract infection  EXAM: CT ABDOMEN AND PELVIS WITH CONTRAST   TECHNIQUE: Multidetector CT imaging of the abdomen and pelvis was performed using  the standard protocol following bolus administration of intravenous contrast.  CONTRAST:  11mL OMNIPAQUE IOHEXOL 300 MG/ML SOLN, 18mL OMNIPAQUE IOHEXOL 300 MG/ML SOLN  COMPARISON:  03/29/2010  FINDINGS: Visualized portions of the lung bases are clear. No acute musculoskeletal findings.  Liver is normal. Gallbladder is surgically absent. Spleen is normal. There is fatty atrophy of the pancreas, mild, in the pancreas is otherwise normal. Adrenal glands are normal. Kidneys are normal. There is no hydronephrosis. There is no perinephric inflammatory change.  Abdominal aorta is normal. Stomach is normal. Small bowel is normal. Appendix is normal. There is stool throughout the entire colon but the colon is otherwise normal.  Bladder is normal. Uterus is normal. There are bilateral ovarian follicles. The largest is on the right and measures 2 cm. There is no free fluid in the abdomen or pelvis. There is no significant adenopathy.  IMPRESSION: Constipation.  No acute findings.   Electronically Signed   By: Skipper Cliche M.D.   On: 09/26/2013 22:32   Imaging Review No results found.   EKG Interpretation None      MDM   Final diagnoses:  None    1. Constipation 2. Abdominal pain  CT scan showing normal appendix. Pain is managed in ED. She appears stable for discharge home. Constipation remedies discussed. Return precautions given.    Dewaine Oats, PA-C 09/30/13 1911

## 2013-09-26 NOTE — Discharge Instructions (Signed)

## 2013-09-26 NOTE — ED Notes (Addendum)
Pt states that she is having abdominal pain that is right sided and moves down to her pelvis. Pt states that she has chronic back pain and so she isn't sure if the pain connects. Pt states pain is also sometimes on the left but intermitant. Pt states her pain increases when she urinates. Pt worried about UTI.

## 2013-10-01 NOTE — ED Provider Notes (Signed)
Medical screening examination/treatment/procedure(s) were performed by non-physician practitioner and as supervising physician I was immediately available for consultation/collaboration.   EKG Interpretation   Date/Time:  Sunday September 26 2013 21:31:13 EDT Ventricular Rate:  77 PR Interval:  152 QRS Duration: 78 QT Interval:  394 QTC Calculation: 445 R Axis:   -8 Text Interpretation:  Normal sinus rhythm Minimal voltage criteria for  LVH, may be normal variant Borderline ECG ED PHYSICIAN INTERPRETATION  AVAILABLE IN [facility] Confirmed by TEST, Record ([id number]) on  09/28/2013 7:15:19 AM        Malvin Johns, MD 10/01/13 [phone]

## 2013-12-10 ENCOUNTER — Ambulatory Visit (INDEPENDENT_AMBULATORY_CARE_PROVIDER_SITE_OTHER): Payer: 59 | Admitting: Family Medicine

## 2013-12-10 DIAGNOSIS — Z23 Encounter for immunization: Secondary | ICD-10-CM

## 2014-01-31 LAB — OB RESULTS CONSOLE RPR: RPR: NONREACTIVE

## 2014-01-31 LAB — OB RESULTS CONSOLE ANTIBODY SCREEN: Antibody Screen: NEGATIVE

## 2014-01-31 LAB — OB RESULTS CONSOLE HEPATITIS B SURFACE ANTIGEN: HEP B S AG: NEGATIVE

## 2014-01-31 LAB — OB RESULTS CONSOLE RUBELLA ANTIBODY, IGM: Rubella: IMMUNE

## 2014-01-31 LAB — OB RESULTS CONSOLE ABO/RH: RH TYPE: NEGATIVE

## 2014-01-31 LAB — OB RESULTS CONSOLE HIV ANTIBODY (ROUTINE TESTING): HIV: NONREACTIVE

## 2014-07-22 ENCOUNTER — Other Ambulatory Visit: Payer: Self-pay | Admitting: Obstetrics

## 2014-08-12 ENCOUNTER — Other Ambulatory Visit: Payer: Self-pay | Admitting: Obstetrics

## 2014-08-24 ENCOUNTER — Encounter (HOSPITAL_COMMUNITY): Payer: Self-pay

## 2014-08-25 ENCOUNTER — Encounter (HOSPITAL_COMMUNITY)
Admission: RE | Admit: 2014-08-25 | Discharge: 2014-08-25 | Disposition: A | Discharge status: Home / Self Care | Payer: BLUE CROSS/BLUE SHIELD | Source: Ambulatory Visit | Attending: Obstetrics | Admitting: Obstetrics

## 2014-08-25 ENCOUNTER — Encounter (HOSPITAL_COMMUNITY): Payer: Self-pay

## 2014-08-25 HISTORY — DX: Headache, unspecified: R51.9

## 2014-08-25 HISTORY — DX: Gastro-esophageal reflux disease without esophagitis: K21.9

## 2014-08-25 HISTORY — DX: Personal history of urinary calculi: Z87.442

## 2014-08-25 HISTORY — DX: Headache: R51

## 2014-08-25 LAB — CBC
HCT: 32.7 % — ABNORMAL LOW (ref 36.0–46.0)
Hemoglobin: 10.4 g/dL — ABNORMAL LOW (ref 12.0–15.0)
MCH: 25.6 pg — ABNORMAL LOW (ref 26.0–34.0)
MCHC: 31.8 g/dL (ref 30.0–36.0)
MCV: 80.5 fL (ref 78.0–100.0)
Platelets: 367 10*3/uL (ref 150–400)
RBC: 4.06 MIL/uL (ref 3.87–5.11)
RDW: 15.5 % (ref 11.5–15.5)
WBC: 9.5 10*3/uL (ref 4.0–10.5)

## 2014-08-25 NOTE — Patient Instructions (Addendum)
   Your procedure is scheduled on:  Saturday, June 25  Enter through the Main Entrance of [facility] at: 8 AM Pick up the phone at the desk and dial [phone] and inform us of your arrival.  Please call this number if you have any problems the morning of surgery: [phone]  Remember: Do not eat or drink after midnight: Friday Take these medicines the morning of surgery with a SIP OF WATER:  nexium  Do not wear jewelry, make-up, or FINGER nail polish No metal in your hair or on your body. Do not wear lotions, powders, perfumes.  You may wear deodorant.  Do not bring valuables to the hospital. Contacts, dentures or bridgework may not be worn into surgery.  Leave suitcase in the car. After Surgery it may be brought to your room. For patients being admitted to the hospital, checkout time is 11:00am the day of discharge.  Home with husband Audelia Acton cell [phone]

## 2014-08-26 LAB — RPR: RPR: NONREACTIVE

## 2014-08-26 NOTE — H&P (Signed)
Grace Moore is a 29 y.o. G2P1002 at [redacted]w[redacted]d presenting for RCS with BTL. Pt notes rare contractions. Good fetal movement, No vaginal bleeding, not leaking fluid.  [facility] at [facility] since 1st trimester - Rh negative, s/pRhogam - morbid obeisty, 25# wt gain in preg, DS 115 - fetal growth- 90% at 30 and 34 wks - prior c/s- arrest dilation during IOL at 41 wks. Pt desires RCS - b/l CPC but nl NT - desires TL- states 100% sure   Prenatal Transfer Tool  Maternal Diabetes: No Genetic Screening: Normal Maternal Ultrasounds/Referrals: Abnormal:  Findings:   Isolated choroid plexus cyst Fetal Ultrasounds or other Referrals:  None Maternal Substance Abuse:  No Significant Maternal Medications:  None Significant Maternal Lab Results: None     OB History    Gravida Para Term Preterm AB TAB SAB Ectopic Multiple Living   2 1 1       2      Past Medical History  Diagnosis Date  . GERD (gastroesophageal reflux disease)   . History of kidney stones     passed stone, no surgery  . Headache     no med, lay down, sleep   Past Surgical History  Procedure Laterality Date  . Wrist surgery      x 2 one left and 1 right   . Cesarean section  11/2009    [place]  . Cholecystectomy    . Wisdom tooth extraction     Family History: family history is not on file. Social History:  reports that she has never smoked. She has never used smokeless tobacco. She reports that she does not drink alcohol or use illicit drugs.  Review of Systems - Negative except discomfort of pregnancy     Last menstrual period 11/29/2013.  Filed Vitals:   08/27/14 0729  BP: 133/87  Pulse: 88  Temp: 98.4 F (36.9 C)  TempSrc: Oral  Resp: 20  SpO2: 100%     Physical Exam:  Gen: well appearing, no distress  Back: no CVAT Abd: gravid, NT, no RUQ pain LE: trace edema, equal bilaterally, non-tender   Prenatal labs: ABO, Rh: --/--/A NEG (06/23 1105) Antibody: NEG (06/23 1105) Rubella:  immune RPR:  Non Reactive (06/23 1105)  HBsAg: Negative (11/30 0000)  HIV: Non-reactive (11/30 0000)  GBS:   neg 1 hr Glucola 115  Genetic screening nl NT, nl AFP Anatomy US b/l CPC, otherwise nl  CBC    Component Value Date/Time   WBC 9.5 08/25/2014 1105   RBC 4.06 08/25/2014 1105   HGB 10.4* 08/25/2014 1105   HCT 32.7* 08/25/2014 1105   PLT 367 08/25/2014 1105   MCV 80.5 08/25/2014 1105   MCH 25.6* 08/25/2014 1105   MCHC 31.8 08/25/2014 1105   RDW 15.5 08/25/2014 1105   LYMPHSABS 2.4 09/26/2013 2050   MONOABS 0.9 09/26/2013 2050   EOSABS 0.1 09/26/2013 2050   BASOSABS 0.0 09/26/2013 2050       Assessment/Plan: 29 y.o. G2P1002 at [redacted]w[redacted]d - prior PCS, plan RCS, pt understands R/B.  - undesired fertility, plan TL - anemia, watch EBL, consider iron .   Grace Moore A. 08/27/2014 7:56 AM

## 2014-08-26 NOTE — Anesthesia Preprocedure Evaluation (Addendum)
Anesthesia Evaluation  Patient identified by MRN, date of birth, ID band Patient awake    Reviewed: Allergy & Precautions, NPO status , Patient's Chart, lab work & pertinent test results, reviewed documented beta blocker date and time   Airway Mallampati: II   Neck ROM: Full    Dental  (+) Teeth Intact, Dental Advisory Given   Pulmonary  breath sounds clear to auscultation        Cardiovascular Rhythm:Regular  EKG 2015 OK   Neuro/Psych  Headaches,    GI/Hepatic GERD-  Medicated,  Endo/Other  Morbid obesity  Renal/GU      Musculoskeletal   Abdominal (+) + obese,   Peds  Hematology 11/32  Plts 367   Anesthesia Other Findings   Reproductive/Obstetrics                           Anesthesia Physical Anesthesia Plan  ASA: III  Anesthesia Plan: Spinal   Post-op Pain Management:    Induction:   Airway Management Planned: Natural Airway  Additional Equipment:   Intra-op Plan:   Post-operative Plan:   Informed Consent: I have reviewed the patients History and Physical, chart, labs and discussed the procedure including the risks, benefits and alternatives for the proposed anesthesia with the patient or authorized representative who has indicated his/her understanding and acceptance.     Plan Discussed with:   Anesthesia Plan Comments: (Have discussed the spinal with her and the fact that spinal may be more difficult because of her size.  Explained that [place] would be our back-up plan and she is ok with that if necessary.)       Anesthesia Quick Evaluation

## 2014-08-27 ENCOUNTER — Encounter (HOSPITAL_COMMUNITY)
Admission: RE | Disposition: A | Discharge status: Home / Self Care | Payer: Self-pay | Source: Ambulatory Visit | Attending: Obstetrics

## 2014-08-27 ENCOUNTER — Encounter (HOSPITAL_COMMUNITY): Payer: Self-pay | Admitting: *Deleted

## 2014-08-27 ENCOUNTER — Inpatient Hospital Stay (HOSPITAL_COMMUNITY): Payer: BLUE CROSS/BLUE SHIELD | Admitting: Anesthesiology

## 2014-08-27 ENCOUNTER — Inpatient Hospital Stay (HOSPITAL_COMMUNITY)
Admission: RE | Admit: 2014-08-27 | Discharge: 2014-08-29 | DRG: 765 | Disposition: A | Discharge status: Home / Self Care | Payer: BLUE CROSS/BLUE SHIELD | Source: Ambulatory Visit | Attending: Obstetrics | Admitting: Obstetrics

## 2014-08-27 DIAGNOSIS — Z87442 Personal history of urinary calculi: Secondary | ICD-10-CM | POA: Diagnosis not present

## 2014-08-27 DIAGNOSIS — Z9049 Acquired absence of other specified parts of digestive tract: Secondary | ICD-10-CM | POA: Diagnosis present

## 2014-08-27 DIAGNOSIS — O9962 Diseases of the digestive system complicating childbirth: Secondary | ICD-10-CM | POA: Diagnosis present

## 2014-08-27 DIAGNOSIS — O358XX0 Maternal care for other (suspected) fetal abnormality and damage, not applicable or unspecified: Secondary | ICD-10-CM | POA: Diagnosis present

## 2014-08-27 DIAGNOSIS — O3421 Maternal care for scar from previous cesarean delivery: Secondary | ICD-10-CM | POA: Diagnosis present

## 2014-08-27 DIAGNOSIS — K219 Gastro-esophageal reflux disease without esophagitis: Secondary | ICD-10-CM | POA: Diagnosis present

## 2014-08-27 DIAGNOSIS — Z302 Encounter for sterilization: Secondary | ICD-10-CM

## 2014-08-27 DIAGNOSIS — Z3A38 38 weeks gestation of pregnancy: Secondary | ICD-10-CM | POA: Diagnosis present

## 2014-08-27 DIAGNOSIS — D500 Iron deficiency anemia secondary to blood loss (chronic): Secondary | ICD-10-CM | POA: Diagnosis not present

## 2014-08-27 DIAGNOSIS — N858 Other specified noninflammatory disorders of uterus: Secondary | ICD-10-CM | POA: Diagnosis present

## 2014-08-27 DIAGNOSIS — Z6841 Body Mass Index (BMI) 40.0 and over, adult: Secondary | ICD-10-CM

## 2014-08-27 DIAGNOSIS — O9903 Anemia complicating the puerperium: Secondary | ICD-10-CM | POA: Diagnosis not present

## 2014-08-27 DIAGNOSIS — Z6791 Unspecified blood type, Rh negative: Secondary | ICD-10-CM

## 2014-08-27 DIAGNOSIS — D509 Iron deficiency anemia, unspecified: Secondary | ICD-10-CM | POA: Diagnosis present

## 2014-08-27 DIAGNOSIS — O99019 Anemia complicating pregnancy, unspecified trimester: Secondary | ICD-10-CM

## 2014-08-27 DIAGNOSIS — O9902 Anemia complicating childbirth: Secondary | ICD-10-CM | POA: Diagnosis present

## 2014-08-27 DIAGNOSIS — O99214 Obesity complicating childbirth: Secondary | ICD-10-CM | POA: Diagnosis present

## 2014-08-27 DIAGNOSIS — O26899 Other specified pregnancy related conditions, unspecified trimester: Secondary | ICD-10-CM | POA: Diagnosis present

## 2014-08-27 DIAGNOSIS — D62 Acute posthemorrhagic anemia: Secondary | ICD-10-CM | POA: Diagnosis not present

## 2014-08-27 DIAGNOSIS — Z98891 History of uterine scar from previous surgery: Secondary | ICD-10-CM

## 2014-08-27 LAB — PREPARE RBC (CROSSMATCH)

## 2014-08-27 SURGERY — Surgical Case
Anesthesia: Spinal | Site: Abdomen | Laterality: Bilateral

## 2014-08-27 MED ORDER — DIPHENHYDRAMINE HCL 50 MG/ML IJ SOLN: 12.5000 mg | INTRAMUSCULAR | Status: DC | PRN

## 2014-08-27 MED ORDER — NALBUPHINE HCL 10 MG/ML IJ SOLN: 5.0000 mg | Freq: Once | INTRAMUSCULAR | Status: AC | PRN

## 2014-08-27 MED ORDER — DIPHENHYDRAMINE HCL 25 MG PO CAPS: 25.0000 mg | ORAL_CAPSULE | ORAL | Status: DC | PRN

## 2014-08-27 MED ORDER — LACTATED RINGERS IV SOLN
INTRAVENOUS | Status: DC
  Administered 2014-08-27 (×3): via INTRAVENOUS

## 2014-08-27 MED ORDER — KETOROLAC TROMETHAMINE 30 MG/ML IJ SOLN: 30.0000 mg | Freq: Four times a day (QID) | INTRAMUSCULAR | Status: DC | PRN

## 2014-08-27 MED ORDER — ONDANSETRON HCL 4 MG/2ML IJ SOLN
INTRAMUSCULAR | Status: AC
  Filled 2014-08-27: qty 2

## 2014-08-27 MED ORDER — ONDANSETRON HCL 4 MG/2ML IJ SOLN: 4.0000 mg | Freq: Three times a day (TID) | INTRAMUSCULAR | Status: DC | PRN

## 2014-08-27 MED ORDER — NALBUPHINE HCL 10 MG/ML IJ SOLN
5.0000 mg | INTRAMUSCULAR | Status: DC | PRN
  Administered 2014-08-27: 5 mg via INTRAVENOUS
  Filled 2014-08-27: qty 1

## 2014-08-27 MED ORDER — MORPHINE SULFATE 0.5 MG/ML IJ SOLN
INTRAMUSCULAR | Status: AC
  Filled 2014-08-27: qty 10

## 2014-08-27 MED ORDER — PHENYLEPHRINE 8 MG IN D5W 100 ML (0.08MG/ML) PREMIX OPTIME
INJECTION | INTRAVENOUS | Status: DC | PRN
  Administered 2014-08-27: 60 ug/min via INTRAVENOUS

## 2014-08-27 MED ORDER — KETOROLAC TROMETHAMINE 30 MG/ML IJ SOLN
30.0000 mg | Freq: Four times a day (QID) | INTRAMUSCULAR | Status: DC | PRN
  Administered 2014-08-27: 30 mg via INTRAVENOUS

## 2014-08-27 MED ORDER — DIPHENHYDRAMINE HCL 25 MG PO CAPS: 25.0000 mg | ORAL_CAPSULE | Freq: Four times a day (QID) | ORAL | Status: DC | PRN

## 2014-08-27 MED ORDER — BUPIVACAINE IN DEXTROSE 0.75-8.25 % IT SOLN
INTRATHECAL | Status: DC | PRN
  Administered 2014-08-27: 10.5 mg via INTRATHECAL

## 2014-08-27 MED ORDER — LACTATED RINGERS IV SOLN
Freq: Once | INTRAVENOUS | Status: AC
  Administered 2014-08-27: 08:00:00 via INTRAVENOUS

## 2014-08-27 MED ORDER — CEFAZOLIN SODIUM-DEXTROSE 2-3 GM-% IV SOLR
INTRAVENOUS | Status: AC
  Filled 2014-08-27: qty 50

## 2014-08-27 MED ORDER — OXYTOCIN 10 UNIT/ML IJ SOLN
INTRAMUSCULAR | Status: AC
  Filled 2014-08-27: qty 4

## 2014-08-27 MED ORDER — PRENATAL MULTIVITAMIN CH
1.0000 | ORAL_TABLET | Freq: Every day | ORAL | Status: DC
  Administered 2014-08-28 – 2014-08-29 (×2): 1 via ORAL
  Filled 2014-08-27 (×2): qty 1

## 2014-08-27 MED ORDER — MENTHOL 3 MG MT LOZG: 1.0000 | LOZENGE | OROMUCOSAL | Status: DC | PRN

## 2014-08-27 MED ORDER — FENTANYL CITRATE (PF) 100 MCG/2ML IJ SOLN
INTRAMUSCULAR | Status: AC
  Filled 2014-08-27: qty 2

## 2014-08-27 MED ORDER — KETOROLAC TROMETHAMINE 30 MG/ML IJ SOLN
INTRAMUSCULAR | Status: AC
  Administered 2014-08-27: 30 mg via INTRAVENOUS
  Filled 2014-08-27: qty 1

## 2014-08-27 MED ORDER — MORPHINE SULFATE (PF) 0.5 MG/ML IJ SOLN
INTRAMUSCULAR | Status: DC | PRN
  Administered 2014-08-27: .2 mg via EPIDURAL

## 2014-08-27 MED ORDER — FENTANYL CITRATE (PF) 100 MCG/2ML IJ SOLN
25.0000 ug | INTRAMUSCULAR | Status: DC | PRN
  Administered 2014-08-27 (×2): 50 ug via INTRAVENOUS

## 2014-08-27 MED ORDER — ONDANSETRON HCL 4 MG/2ML IJ SOLN
INTRAMUSCULAR | Status: DC | PRN
  Administered 2014-08-27: 4 mg via INTRAVENOUS

## 2014-08-27 MED ORDER — FENTANYL CITRATE (PF) 100 MCG/2ML IJ SOLN
INTRAMUSCULAR | Status: DC | PRN
Start: 2014-08-27 — End: 2014-08-27
  Administered 2014-08-27: 20 ug via INTRAVENOUS

## 2014-08-27 MED ORDER — SODIUM CHLORIDE 0.9 % IJ SOLN: 3.0000 mL | INTRAMUSCULAR | Status: DC | PRN

## 2014-08-27 MED ORDER — OXYTOCIN 10 UNIT/ML IJ SOLN
40.0000 [IU] | INTRAVENOUS | Status: DC | PRN
  Administered 2014-08-27: 40 [IU] via INTRAVENOUS

## 2014-08-27 MED ORDER — NALOXONE HCL 1 MG/ML IJ SOLN
1.0000 ug/kg/h | INTRAVENOUS | Status: DC | PRN
  Filled 2014-08-27: qty 2

## 2014-08-27 MED ORDER — OXYCODONE-ACETAMINOPHEN 5-325 MG PO TABS
2.0000 | ORAL_TABLET | ORAL | Status: DC | PRN
  Filled 2014-08-27: qty 2

## 2014-08-27 MED ORDER — WITCH HAZEL-GLYCERIN EX PADS: 1.0000 "application " | MEDICATED_PAD | CUTANEOUS | Status: DC | PRN

## 2014-08-27 MED ORDER — FENTANYL CITRATE (PF) 100 MCG/2ML IJ SOLN
INTRAMUSCULAR | Status: AC
  Administered 2014-08-27: 50 ug via INTRAVENOUS
  Filled 2014-08-27: qty 2

## 2014-08-27 MED ORDER — NALOXONE HCL 0.4 MG/ML IJ SOLN: 0.4000 mg | INTRAMUSCULAR | Status: DC | PRN

## 2014-08-27 MED ORDER — SIMETHICONE 80 MG PO CHEW: 80.0000 mg | CHEWABLE_TABLET | ORAL | Status: DC | PRN

## 2014-08-27 MED ORDER — SCOPOLAMINE 1 MG/3DAYS TD PT72
1.0000 | MEDICATED_PATCH | Freq: Once | TRANSDERMAL | Status: DC
  Administered 2014-08-27: 1.5 mg via TRANSDERMAL

## 2014-08-27 MED ORDER — MEPERIDINE HCL 25 MG/ML IJ SOLN: 6.2500 mg | INTRAMUSCULAR | Status: DC | PRN

## 2014-08-27 MED ORDER — DIBUCAINE 1 % RE OINT: 1.0000 "application " | TOPICAL_OINTMENT | RECTAL | Status: DC | PRN

## 2014-08-27 MED ORDER — ZOLPIDEM TARTRATE 5 MG PO TABS: 5.0000 mg | ORAL_TABLET | Freq: Every evening | ORAL | Status: DC | PRN

## 2014-08-27 MED ORDER — ACETAMINOPHEN 500 MG PO TABS
1000.0000 mg | ORAL_TABLET | Freq: Four times a day (QID) | ORAL | Status: AC
  Filled 2014-08-27: qty 2

## 2014-08-27 MED ORDER — BUPIVACAINE HCL (PF) 0.25 % IJ SOLN
INTRAMUSCULAR | Status: AC
  Filled 2014-08-27: qty 30

## 2014-08-27 MED ORDER — SCOPOLAMINE 1 MG/3DAYS TD PT72
MEDICATED_PATCH | TRANSDERMAL | Status: AC
  Administered 2014-08-27: 1.5 mg via TRANSDERMAL
  Filled 2014-08-27: qty 1

## 2014-08-27 MED ORDER — SENNOSIDES-DOCUSATE SODIUM 8.6-50 MG PO TABS
2.0000 | ORAL_TABLET | ORAL | Status: DC
  Administered 2014-08-28 (×2): 2 via ORAL
  Filled 2014-08-27 (×2): qty 2

## 2014-08-27 MED ORDER — ACETAMINOPHEN 325 MG PO TABS
650.0000 mg | ORAL_TABLET | ORAL | Status: DC | PRN
  Administered 2014-08-28 – 2014-08-29 (×2): 650 mg via ORAL
  Filled 2014-08-27 (×2): qty 2

## 2014-08-27 MED ORDER — PHENYLEPHRINE 8 MG IN D5W 100 ML (0.08MG/ML) PREMIX OPTIME
INJECTION | INTRAVENOUS | Status: AC
  Filled 2014-08-27: qty 100

## 2014-08-27 MED ORDER — SIMETHICONE 80 MG PO CHEW
80.0000 mg | CHEWABLE_TABLET | Freq: Three times a day (TID) | ORAL | Status: DC
  Administered 2014-08-27 – 2014-08-29 (×5): 80 mg via ORAL
  Filled 2014-08-27 (×6): qty 1

## 2014-08-27 MED ORDER — CEFAZOLIN SODIUM-DEXTROSE 2-3 GM-% IV SOLR
2.0000 g | INTRAVENOUS | Status: AC
  Administered 2014-08-27: 2 g via INTRAVENOUS

## 2014-08-27 MED ORDER — IBUPROFEN 600 MG PO TABS
600.0000 mg | ORAL_TABLET | Freq: Four times a day (QID) | ORAL | Status: DC
  Administered 2014-08-27 – 2014-08-29 (×8): 600 mg via ORAL
  Filled 2014-08-27 (×8): qty 1

## 2014-08-27 MED ORDER — OXYCODONE-ACETAMINOPHEN 5-325 MG PO TABS: 1.0000 | ORAL_TABLET | ORAL | Status: DC | PRN

## 2014-08-27 MED ORDER — NALBUPHINE HCL 10 MG/ML IJ SOLN: 5.0000 mg | INTRAMUSCULAR | Status: DC | PRN

## 2014-08-27 MED ORDER — OXYTOCIN 40 UNITS IN LACTATED RINGERS INFUSION - SIMPLE MED: 62.5000 mL/h | INTRAVENOUS | Status: AC

## 2014-08-27 MED ORDER — SCOPOLAMINE 1 MG/3DAYS TD PT72
1.0000 | MEDICATED_PATCH | Freq: Once | TRANSDERMAL | Status: DC
  Filled 2014-08-27: qty 1

## 2014-08-27 MED ORDER — LACTATED RINGERS IV SOLN
INTRAVENOUS | Status: DC
  Administered 2014-08-27: 20:00:00 via INTRAVENOUS

## 2014-08-27 MED ORDER — PROMETHAZINE HCL 25 MG/ML IJ SOLN: 6.2500 mg | INTRAMUSCULAR | Status: DC | PRN

## 2014-08-27 MED ORDER — LANOLIN HYDROUS EX OINT: 1.0000 "application " | TOPICAL_OINTMENT | CUTANEOUS | Status: DC | PRN

## 2014-08-27 MED ORDER — SIMETHICONE 80 MG PO CHEW
80.0000 mg | CHEWABLE_TABLET | ORAL | Status: DC
  Administered 2014-08-28 (×2): 80 mg via ORAL
  Filled 2014-08-27 (×2): qty 1

## 2014-08-27 MED ORDER — TETANUS-DIPHTH-ACELL PERTUSSIS 5-2.5-18.5 LF-MCG/0.5 IM SUSP: 0.5000 mL | Freq: Once | INTRAMUSCULAR | Status: DC

## 2014-08-27 SURGICAL SUPPLY — 33 items
BENZOIN TINCTURE PRP APPL 2/3 (GAUZE/BANDAGES/DRESSINGS) ×2 IMPLANT
CLAMP CORD UMBIL (MISCELLANEOUS) IMPLANT
CLOTH BEACON ORANGE TIMEOUT ST (SAFETY) ×2 IMPLANT
CONTAINER PREFILL 10% NBF 15ML (MISCELLANEOUS) IMPLANT
DRAPE SHEET LG 3/4 BI-LAMINATE (DRAPES) IMPLANT
DRSG OPSITE POSTOP 4X10 (GAUZE/BANDAGES/DRESSINGS) ×2 IMPLANT
DURAPREP 26ML APPLICATOR (WOUND CARE) ×2 IMPLANT
ELECT REM PT RETURN 9FT ADLT (ELECTROSURGICAL) ×2
ELECTRODE REM PT RTRN 9FT ADLT (ELECTROSURGICAL) ×1 IMPLANT
EXTRACTOR VACUUM M CUP 4 TUBE (SUCTIONS) IMPLANT
GLOVE BIO SURGEON STRL SZ 6.5 (GLOVE) ×2 IMPLANT
GLOVE BIOGEL PI IND STRL 7.0 (GLOVE) ×1 IMPLANT
GLOVE BIOGEL PI INDICATOR 7.0 (GLOVE) ×1
GOWN STRL REUS W/TWL LRG LVL3 (GOWN DISPOSABLE) ×4 IMPLANT
KIT ABG SYR 3ML LUER SLIP (SYRINGE) IMPLANT
NEEDLE HYPO 22GX1.5 SAFETY (NEEDLE) IMPLANT
NEEDLE HYPO 25X5/8 SAFETYGLIDE (NEEDLE) IMPLANT
NS IRRIG 1000ML POUR BTL (IV SOLUTION) ×2 IMPLANT
PACK C SECTION WH (CUSTOM PROCEDURE TRAY) ×2 IMPLANT
PAD OB MATERNITY 4.3X12.25 (PERSONAL CARE ITEMS) ×2 IMPLANT
STAPLER VISISTAT 35W (STAPLE) IMPLANT
STRIP CLOSURE SKIN 1/2X4 (GAUZE/BANDAGES/DRESSINGS) ×2 IMPLANT
SUT MON AB 4-0 PS1 27 (SUTURE) ×2 IMPLANT
SUT PLAIN 0 NONE (SUTURE) IMPLANT
SUT PLAIN 2 0 XLH (SUTURE) IMPLANT
SUT VIC AB 0 CT1 36 (SUTURE) ×4 IMPLANT
SUT VIC AB 0 CTX 36 (SUTURE) ×2
SUT VIC AB 0 CTX36XBRD ANBCTRL (SUTURE) ×2 IMPLANT
SUT VIC AB 2-0 CT1 27 (SUTURE) ×1
SUT VIC AB 2-0 CT1 TAPERPNT 27 (SUTURE) ×1 IMPLANT
SYR CONTROL 10ML LL (SYRINGE) IMPLANT
TOWEL OR 17X24 6PK STRL BLUE (TOWEL DISPOSABLE) ×2 IMPLANT
TRAY FOLEY CATH SILVER 14FR (SET/KITS/TRAYS/PACK) IMPLANT

## 2014-08-27 NOTE — Op Note (Signed)
08/27/2014  10:14 AM  PATIENT:  Grace Grace  29 y.o. female  PRE-OPERATIVE DIAGNOSIS:  Previous Cesarean Section/DESIRE STERILIZATION  POST-OPERATIVE DIAGNOSIS:  Previous Cesarean Section/DESIRE STERILIZATION  PROCEDURE:  Procedure(s) with comments: REPEAT CESAREAN SECTION WITH BILATERAL TUBAL LIGATION  (Bilateral) - EDD: 09/03/14  LTCS, 2 layer closure  SURGEON:  Surgeon(s) and Role:    * Grace Gell, MD - Primary  PHYSICIAN ASSISTANT:   ASSISTANTSDrake Moore, CNM   ANESTHESIA:   spinal  EBL:  Total I/O In: 2000 [I.V.:2000] Out: 900 [Urine:200; Blood:700]  BLOOD ADMINISTERED:none  DRAINS: Urinary Catheter (Foley)   LOCAL MEDICATIONS USED:  NONE  SPECIMEN:  Source of Specimen:  placenta  DISPOSITION OF SPECIMEN:  L&D  COUNTS:  YES  TOURNIQUET:  * No tourniquets in log *  DICTATION: .Note written in EPIC  PLAN OF CARE: Admit to inpatient   PATIENT DISPOSITION:  PACU - hemodynamically stable.   Delay start of Pharmacological VTE agent (>24hrs) due to surgical blood loss or risk of bleeding: yes   Findings:  @BABYSEXEBC @ infant,  APGAR (1 MIN): 9   APGAR (5 MINS): 9   APGAR (10 MINS):   Normal uterus, tubes and ovaries, normal placenta. 3VC, clear amniotic fluid  EBL: 700 cc Antibiotics:   2g Ancef Complications: none  Indications: This is a 29 y.o. year-old, G2P1  At 39'0 admitted for RCS. Risks benefits and alternatives of the procedure were discussed with the patient who agreed to proceed  Procedure:  After informed consent was obtained the patient was taken to the operating room where spinal anesthesia was initiated.  She was prepped and draped in the normal sterile fashion in dorsal supine position with a leftward tilt.  A foley catheter was in place.  A Pfannenstiel skin incision was made 2 cm above the pubic symphysis in the midline with the scalpel.  Dissection was carried down with the Bovie cautery until the fascia was reached. The fascia was  incised in the midline. The incision was extended laterally with the Mayo scissors. The inferior aspect of the fascial incision was grasped with the Coker clamps, elevated up and the underlying rectus muscles were dissected off sharply. The superior aspect of the fascial incision was grasped with the Coker clamps elevated up and the underlying rectus muscles were dissected off sharply.  The peritoneum was entered sharply. The peritoneal incision was extended superiorly and inferiorly with good visualization of the bladder. The bladder blade was inserted and palpation was done to assess the fetal position and the location of the uterine vessels. The lower segment of the uterus was incised sharply with the scalpel and extended  bluntly in the cephalo-caudal fashion. The infant was grasped, brought to the incision,  rotated and the infant was delivered with fundal pressure. The nose and mouth were bulb suctioned. The cord was clamped and cut. The infant was handed off to the waiting pediatrician. The placenta was expressed. The uterus was exteriorized. The uterus was cleared of all clots and debris. The uterine incision was repaired with 0 Vicryl in a running locked fashion.  A second layer of the same suture was used in an imbricating fashion to obtain excellent hemostasis.   The tubes and ovaries were evaluated, pt again confirmed her desire for a tubal ligation. The right tube was visualized out to its fimbriated end. A Babcock clamp was used to elevated the mid-isthmic portion of the tube. A free tie of plan gut suture was used to tie off  a 2 cm knuckle of tube. A second free tie was placed just below the first. A window was created in the mesosalpynx and the the knuckle of tube was transected. An identical procedure was carried out on the contralateral side. After the uterus was returned to the abdomen, using retraction, the transection sites were visualized and found to be hemostatic.    The uterus was then  returned to the abdomen, the gutters were cleared of all clots and debris. The uterine incision was reinspected and found to be hemostatic. The peritoneum was grasped and closed with 2-0 Vicryl in a running fashion. The cut muscle edges and the underside of the fascia were inspected and found to be hemostatic. The fascia was closed with 0 Vicryl in two halves . The subcutaneous tissue was irrigated. Scarpa's layer was closed with a 2-0 plain gut suture. The skin was closed with a 4-0 Monocryl in a single layer. The patient tolerated the procedure well. Sponge lap and needle counts were correct x3 and patient was taken to the recovery room in a stable condition.  Grace Grace A. 08/27/2014 10:18 AM

## 2014-08-27 NOTE — Brief Op Note (Signed)
08/27/2014  10:14 AM  PATIENT:  Grace Grace  29 y.o. female  PRE-OPERATIVE DIAGNOSIS:  Previous Cesarean Section/DESIRE STERILIZATION  POST-OPERATIVE DIAGNOSIS:  Previous Cesarean Section/DESIRE STERILIZATION  PROCEDURE:  Procedure(s) with comments: REPEAT CESAREAN SECTION WITH BILATERAL TUBAL LIGATION  (Bilateral) - EDD: 09/03/14  LTCS, 2 layer closure  SURGEON:  Surgeon(s) and Role:    * Aloha Gell, MD - Primary  PHYSICIAN ASSISTANT:   ASSISTANTSDrake Leach, CNM   ANESTHESIA:   spinal  EBL:  Total I/O In: 2000 [I.V.:2000] Out: 900 [Urine:200; Blood:700]  BLOOD ADMINISTERED:none  DRAINS: Urinary Catheter (Foley)   LOCAL MEDICATIONS USED:  NONE  SPECIMEN:  Source of Specimen:  placenta  DISPOSITION OF SPECIMEN:  L&D  COUNTS:  YES  TOURNIQUET:  * No tourniquets in log *  DICTATION: .Note written in EPIC  PLAN OF CARE: Admit to inpatient   PATIENT DISPOSITION:  PACU - hemodynamically stable.   Delay start of Pharmacological VTE agent (>24hrs) due to surgical blood loss or risk of bleeding: yes

## 2014-08-27 NOTE — Anesthesia Procedure Notes (Signed)
Spinal Patient location during procedure: OR Start time: 08/27/2014 9:05 AM End time: 08/27/2014 9:18 AM Staffing Anesthesiologist: Alexis Frock Preanesthetic Checklist Completed: patient identified, site marked, surgical consent, pre-op evaluation, timeout performed, IV checked, risks and benefits discussed and monitors and equipment checked Spinal Block Patient position: sitting Prep: site prepped and draped and DuraPrep Approach: midline Location: L2-3 Injection technique: single-shot Needle Needle type: Pencil-Tip  Needle gauge: 24 G Needle length: 9 cm Needle insertion depth: 6.5 cm Assessment Sensory level: T4 Additional Notes Tolerated procedure well.  No complications, no paresthesias

## 2014-08-27 NOTE — Anesthesia Postprocedure Evaluation (Signed)
  Anesthesia Post-op Note  Patient: Grace Moore  Procedure(s) Performed: Procedure(s) with comments: REPEAT CESAREAN SECTION WITH BILATERAL TUBAL LIGATION  (Bilateral) - EDD: 09/03/14  Patient Location: PACU  Anesthesia Type:Spinal  Level of Consciousness: awake, alert  and oriented  Airway and Oxygen Therapy: Patient Spontanous Breathing  Post-op Pain: mild  Post-op Assessment: Post-op Vital signs reviewed, Patient's Cardiovascular Status Stable, Respiratory Function Stable, Patent Airway and No signs of Nausea or vomiting              Post-op Vital Signs: Reviewed and stable  Last Vitals:  Filed Vitals:   08/27/14 1045  BP: 111/49  Pulse: 68  Temp:   Resp: 20    Complications: No apparent anesthesia complications

## 2014-08-27 NOTE — Transfer of Care (Signed)
Immediate Anesthesia Transfer of Care Note  Patient: Grace Moore  Procedure(s) Performed: Procedure(s) with comments: REPEAT CESAREAN SECTION WITH BILATERAL TUBAL LIGATION  (Bilateral) - EDD: 09/03/14  Patient Location: PACU  Anesthesia Type:Spinal  Level of Consciousness: awake  Airway & Oxygen Therapy: Patient Spontanous Breathing  Post-op Assessment: Report given to RN and Post -op Vital signs reviewed and stable  Post vital signs: stable  Last Vitals:  Filed Vitals:   08/27/14 0729  BP: 133/87  Pulse: 88  Temp: 36.9 C  Resp: 20    Complications: No apparent anesthesia complications

## 2014-08-28 DIAGNOSIS — Z6791 Unspecified blood type, Rh negative: Secondary | ICD-10-CM

## 2014-08-28 DIAGNOSIS — O99019 Anemia complicating pregnancy, unspecified trimester: Secondary | ICD-10-CM

## 2014-08-28 DIAGNOSIS — O26899 Other specified pregnancy related conditions, unspecified trimester: Secondary | ICD-10-CM | POA: Diagnosis present

## 2014-08-28 DIAGNOSIS — D500 Iron deficiency anemia secondary to blood loss (chronic): Secondary | ICD-10-CM | POA: Diagnosis not present

## 2014-08-28 DIAGNOSIS — D509 Iron deficiency anemia, unspecified: Secondary | ICD-10-CM | POA: Diagnosis present

## 2014-08-28 LAB — CBC
HCT: 29.6 % — ABNORMAL LOW (ref 36.0–46.0)
Hemoglobin: 9.4 g/dL — ABNORMAL LOW (ref 12.0–15.0)
MCH: 25.5 pg — ABNORMAL LOW (ref 26.0–34.0)
MCHC: 31.8 g/dL (ref 30.0–36.0)
MCV: 80.2 fL (ref 78.0–100.0)
Platelets: 320 10*3/uL (ref 150–400)
RBC: 3.69 MIL/uL — AB (ref 3.87–5.11)
RDW: 15.2 % (ref 11.5–15.5)
WBC: 13.7 10*3/uL — AB (ref 4.0–10.5)

## 2014-08-28 MED ORDER — POLYSACCHARIDE IRON COMPLEX 150 MG PO CAPS
150.0000 mg | ORAL_CAPSULE | Freq: Every day | ORAL | Status: DC
  Administered 2014-08-28 – 2014-08-29 (×2): 150 mg via ORAL
  Filled 2014-08-28 (×2): qty 1

## 2014-08-28 MED ORDER — RHO D IMMUNE GLOBULIN 1500 UNIT/2ML IJ SOSY
300.0000 ug | PREFILLED_SYRINGE | Freq: Once | INTRAMUSCULAR | Status: AC
  Administered 2014-08-28: 300 ug via INTRAVENOUS
  Filled 2014-08-28: qty 2

## 2014-08-28 NOTE — Anesthesia Postprocedure Evaluation (Signed)
  Anesthesia Post-op Note  Patient: Grace Moore  Procedure(s) Performed: Procedure(s) with comments: REPEAT CESAREAN SECTION WITH BILATERAL TUBAL LIGATION  (Bilateral) - EDD: 09/03/14  Patient Location: Mother/Baby  Anesthesia Type:Spinal  Level of Consciousness: awake, alert  and oriented  Airway and Oxygen Therapy: Patient Spontanous Breathing  Post-op Pain: none  Post-op Assessment: Post-op Vital signs reviewed, Patient's Cardiovascular Status Stable, Respiratory Function Stable, No signs of Nausea or vomiting, Adequate PO intake, Pain level controlled, No headache and No backache              Post-op Vital Signs: Reviewed and stable  Last Vitals:  Filed Vitals:   08/28/14 0800  BP: 116/54  Pulse: 65  Temp: 37 C  Resp: 18    Complications: No apparent anesthesia complications

## 2014-08-28 NOTE — Progress Notes (Signed)
POD # 1  Subjective: Pt reports feeling well/ Pain controlled with Motrin and long acting spinal narcotic Tolerating po/ Foley d/c'd and voiding without problems/ No n/v/ Flatus absent Activity: ad lib Bleeding is light Newborn info:  Information for the patient's newborn:  Kim, Oki [[id number]]  female   Circumcision: planning/ Feeding: bottle   Objective:  VS:  Filed Vitals:   08/27/14 1942 08/27/14 2350 08/28/14 0350 08/28/14 0800  BP: 111/61 94/57 95/48  116/54  Pulse: 68 67 63 65  Temp: 98.8 F (37.1 C) 98.7 F (37.1 C) 97.9 F (36.6 C) 98.6 F (37 C)  TempSrc: Oral Oral Oral Oral  Resp: 18 18 18 18   SpO2: 96% 96% 96%      I&O: Intake/Output      06/25 0701 - 06/26 0700 06/26 0701 - 06/27 0700   P.O. 240    I.V. 2625    Total Intake 2865     Urine 1150    Blood 700    Total Output 1850     Net +1015          Urine Occurrence 1 x       Recent Labs  08/25/14 1105 08/28/14 0533  WBC 9.5 13.7*  HGB 10.4* 9.4*  HCT 32.7* 29.6*  PLT 367 320    Blood type: --/--/A NEG (06/26 0533) Rubella: Immune (11/30 0000)    Physical Exam:  General: alert, cooperative and no distress CV: Regular rate and rhythm Resp: CTA bilaterally Abdomen: soft, nontender, normal bowel sounds Incision: Covered with Tegaderm and honeycomb dressing; no significant drainage, edema, bruising, or erythema; well approximated with suture Uterine Fundus: firm, below umbilicus, nontender Lochia: minimal Ext: edema trace LE and Homans sign is negative, no sign of DVT    Assessment: POD # 1/ G2P2002/ S/P C/Section & BTL d/t repeat  IDA with compounding ABL anemia Rh negative-baby Rh pos-Rhogam ordered Doing well  Plan: Start Niferex Ambulate TID in halls Continue routine post op orders Consider early discharge tomorrow   Signed: Julianne Handler, Delane Ginger, MSN, CNM 08/28/2014, 10:05 AM

## 2014-08-28 NOTE — Addendum Note (Signed)
Addendum  created 08/28/14 0839 by Jonna Munro, CRNA   Modules edited: Notes Section   Notes Section:  File: [id number]

## 2014-08-29 ENCOUNTER — Encounter (HOSPITAL_COMMUNITY): Payer: Self-pay | Admitting: Obstetrics

## 2014-08-29 LAB — RH IG WORKUP (INCLUDES ABO/RH)
ABO/RH(D): A NEG
Fetal Screen: NEGATIVE
Gestational Age(Wks): 38
UNIT DIVISION: 0

## 2014-08-29 LAB — TYPE AND SCREEN
ABO/RH(D): A NEG
Antibody Screen: NEGATIVE
UNIT DIVISION: 0
Unit division: 0

## 2014-08-29 LAB — BIRTH TISSUE RECOVERY COLLECTION (PLACENTA DONATION)

## 2014-08-29 MED ORDER — ACETAMINOPHEN FOR CIRCUMCISION 160 MG/5 ML
ORAL | Status: AC
  Filled 2014-08-29: qty 1.25

## 2014-08-29 MED ORDER — OXYCODONE-ACETAMINOPHEN 5-325 MG PO TABS: 1.0000 | ORAL_TABLET | ORAL | Status: DC | PRN

## 2014-08-29 MED ORDER — MAGNESIUM OXIDE 400 (241.3 MG) MG PO TABS: 200.0000 mg | ORAL_TABLET | Freq: Every day | ORAL | Status: DC

## 2014-08-29 MED ORDER — POLYSACCHARIDE IRON COMPLEX 150 MG PO CAPS: 150.0000 mg | ORAL_CAPSULE | Freq: Every day | ORAL | Status: DC

## 2014-08-29 MED ORDER — IBUPROFEN 600 MG PO TABS: 600.0000 mg | ORAL_TABLET | Freq: Four times a day (QID) | ORAL | Status: DC

## 2014-08-29 MED ORDER — MAGNESIUM OXIDE 400 (241.3 MG) MG PO TABS
200.0000 mg | ORAL_TABLET | Freq: Every day | ORAL | Status: DC
  Filled 2014-08-29 (×2): qty 0.5

## 2014-08-29 MED ORDER — OXYCODONE-ACETAMINOPHEN 5-325 MG PO TABS
1.0000 | ORAL_TABLET | ORAL | Status: DC | PRN
Start: 2014-08-29 — End: 2014-08-29

## 2014-08-29 NOTE — Discharge Summary (Signed)
POSTOPERATIVE DISCHARGE SUMMARY:  Patient ID: Grace Moore MRN: [id number] DOB/AGE: 1985-10-21 29 y.o.  Admit date: 08/27/2014 Admission Diagnoses: Previous Cesarean Delivery   Discharge date:  08/29/2014 Discharge Diagnoses: S/P Repeat C/S on 08/27/2014        Prenatal history: G2P2002   EDC : 09/05/2014, by Ultrasound Has received prenatal care at [facility] since 9.[redacted] wks gestation. Primary provider : Dr. Pamala Hurry Prenatal course complicated by H/O Previous Cesarean Delivery  Prenatal Labs: ABO, Rh: A NEG (06/26 0533) / Infant Rh Pos - Rhophylac given 6/26 Antibody: NEG (06/23 1105) Rubella: Immune (11/30 0000)   RPR: Non Reactive (06/23 1105)  HBsAg: Negative (11/30 0000)  HIV: Non-reactive (11/30 0000)  GTT : Normal - 114 GBS: Negative  Medical / Surgical History :  Past medical history:  Past Medical History  Diagnosis Date  . GERD (gastroesophageal reflux disease)   . History of kidney stones     passed stone, no surgery  . Headache     no med, lay down, sleep    Past surgical history:  Past Surgical History  Procedure Laterality Date  . Wrist surgery      x 2 one left and 1 right   . Cesarean section  11/2009    [place]  . Cholecystectomy    . Wisdom tooth extraction       Allergies: Review of patient's allergies indicates no known allergies.   Intrapartum Course:  Admitted for scheduled cesarean delivery with bilateral tubal ligation by Dr. Pamala Hurry - see operative report for further details  Physical Exam:   VSS: Blood pressure 121/62, pulse 67, temperature 97.9 F (36.6 C), temperature source Oral, resp. rate 18, last menstrual period 11/29/2013, SpO2 96 %, currently breastfeeding.  LABS:  Recent Labs  08/28/14 0533  WBC 13.7*  HGB 9.4*  PLT 320    Newborn Data Live born female on 08/27/2014 Birth Weight: 8 lb 14.9 oz (4050 g) APGAR: 9, 9  See operative report for further details  Home with mother.  Discharge  Instructions:  Wound Care: keep clean and dry / remove honeycomb POD 5 Postpartum Instructions: [facility] discharge booklet - instructions reviewed Medications:    Medication List    TAKE these medications        amoxicillin-clavulanate 875-125 MG per tablet  Commonly known as:  AUGMENTIN  Take 1 tablet by mouth 2 (two) times daily.     cholecalciferol 1000 UNITS tablet  Commonly known as:  VITAMIN D  Take 1,000 Units by mouth daily.     esomeprazole 20 MG capsule  Commonly known as:  NEXIUM  Take 20 mg by mouth daily at 12 noon.     ibuprofen 600 MG tablet  Commonly known as:  ADVIL,MOTRIN  Take 1 tablet (600 mg total) by mouth every 6 (six) hours.     iron polysaccharides 150 MG capsule  Commonly known as:  NIFEREX  Take 1 capsule (150 mg total) by mouth daily.     magnesium citrate Soln  Take 296 mLs (1 Bottle total) by mouth once.     magnesium oxide 400 (241.3 MG) MG tablet  Commonly known as:  MAG-OX  Take 0.5 tablets (200 mg total) by mouth daily.     oxyCODONE-acetaminophen 5-325 MG per tablet  Commonly known as:  PERCOCET/ROXICET  Take 1 tablet by mouth every 4 (four) hours as needed (for pain scale 4-7).     polyethylene glycol packet  Commonly known as:  MIRALAX  Take 17 g by mouth daily.     sodium phosphate 7-19 GM/118ML Enem  Place 133 mLs (1 enema total) rectally daily as needed for severe constipation.           Follow-up Information    Follow up with [facility] MD. Schedule an appointment as soon as possible for a visit in 6 weeks.   Specialty:  Obstetrics and Gynecology   Why:  postpartum visit   Contact information:   [address] [place] [address] [phone]         Signed: Laury Deep, Jerilynn Mages, MSN, CNM 08/29/2014, 10:18 AM

## 2014-08-29 NOTE — Discharge Instructions (Signed)
Breast Pumping Tips °If you are breastfeeding, there may be times when you cannot feed your baby directly. Returning to work or going on a trip are common examples. Pumping allows you to store breast milk and feed it to your baby later.  °You may not get much milk when you first start to pump. Your breasts should start to make more after a few days. If you pump at the times you usually feed your baby, you may be able to keep making enough milk to feed your baby without also using formula. The more often you pump, the more milk you will produce.  °WHEN SHOULD I PUMP?  °· You can begin to pump soon after delivery. However, some experts recommend waiting about 4 weeks before giving your infant a bottle to make sure breastfeeding is going well.  °· If you plan to return to work, begin pumping a few weeks before. This will help you develop techniques that work best for you. It also lets you build up a supply of breast milk.   °· When you are with your infant, feed on demand and pump after each feeding.   °· When you are away from your infant for several hours, pump for about 15 minutes every 2-3 hours. Pump both breasts at the same time if you can.   °· If your infant has a formula feeding, make sure to pump around the same time.     °· If you drink any alcohol, wait 2 hours before pumping.   °HOW DO I PREPARE TO PUMP? °Your let-down reflex is the natural reaction to stimulation that makes your breast milk flow. It is easier to stimulate this reflex when you are relaxed. Find relaxation techniques that work for you. If you have difficulty with your let-down reflex, try these methods:  °· Smell one of your infant's blankets or an item of clothing.   °· Look at a picture or video of your infant.   °· Sit in a quiet, private space.   °· Massage the breast you plan to pump.   °· Place soothing warmth on the breast.   °· Play relaxing music.   °WHAT ARE SOME GENERAL BREAST PUMPING TIPS? °· Wash your hands before you pump. You  do not need to wash your nipples or breasts. °· There are three ways to pump. °¨ You can use your hand to massage and compress your breast. °¨ You can use a handheld manual pump. °¨ You can use an electric pump.   °· Make sure the suction cup (flange) on the breast pump is the right size. Place the flange directly over the nipple. If it is the wrong size or placed the wrong way, it may be painful and cause nipple damage.   °· If pumping is uncomfortable, apply a small amount of purified or modified lanolin to your nipple and areola. °· If you are using an electric pump, adjust the speed and suction power to be more comfortable. °· If pumping is painful or if you are not getting very much milk, you may need a different type of pump. A lactation consultant can help you determine what type of pump to use.   °· Keep a full water bottle near you at all times. Drinking lots of fluid helps you make more milk.  °· You can store your milk to use later. Pumped breast milk can be stored in a sealable, sterile container or plastic bag. Label all stored breast milk with the date you pumped it. °¨ Milk can stay out at room temperature for up to 8 hours. °¨   You can store your milk in the refrigerator for up to 8 days. °¨ You can store your milk in the freezer for 3 months. Thaw frozen milk using warm water. Do not put it in the microwave. °· Do not smoke. Smoking can lower your milk supply and harm your infant. If you need help quitting, ask your health care provider to recommend a program.   °WHEN SHOULD I CALL MY HEALTH CARE PROVIDER OR A LACTATION CONSULTANT? °· You are having trouble pumping. °· You are concerned that you are not making enough milk. °· You have nipple pain, soreness, or redness. °· You want to use birth control. Birth control pills may lower your milk supply. Talk to your health care provider about your options. °Document Released: 08/08/2009 Document Revised: 02/23/2013 Document Reviewed:  12/11/2012 °[employer]® Patient Information ©2015 [employer]. This information is not intended to replace advice given to you by your health care provider. Make sure you discuss any questions you have with your health care provider. ° °Nutrition for the New Mother  °A new mother needs good health and nutrition so she can have energy to take care of a new baby. Whether a mother breastfeeds or formula feeds the baby, it is important to have a well-balanced diet. Foods from all the food groups should be chosen to meet the new mother's energy needs and to give her the nutrients needed for repair and healing.  °A HEALTHY EATING PLAN °The My Pyramid plan for Moms outlines what you should eat to help you and your baby stay healthy. The energy and amount of food you need depends on whether or not you are breastfeeding. If you are breastfeeding you will need more nutrients. If you choose not to breastfeed, your nutrition goal should be to return to a healthy weight. Limiting calories may be needed if you are not breastfeeding.  °HOME CARE INSTRUCTIONS  °· For a personal plan based on your unique needs, see your Registered Dietitian or visit www.mypyramid.gov. °· Eat a variety of foods. The plan below will help guide you. The following chart has a suggested daily meal plan from the My Pyramid for Moms. °· Eat a variety of fruits and vegetables. °· Eat more dark green and orange vegetables and cooked dried beans. °· Make half your grains whole grains. Choose whole instead of refined grains. °· Choose low-fat or lean meats and poultry. °· Choose low-fat or fat-free dairy products like milk, cheese, or yogurt. °Fruits °· Breastfeeding: 2 cups °· Non-Breastfeeding: 2 cups °· What Counts as a serving? °¨ 1 cup of fruit or juice. °¨ ½ cup dried fruit. °Vegetables °· Breastfeeding: 3 cups °· Non-Breastfeeding: 2 ½ cups °· What Counts as a serving? °¨ 1 cup raw or cooked vegetables. °¨ Juice or 2 cups raw leafy  vegetables. °Grains °· Breastfeeding: 8 oz °· Non-Breastfeeding: 6 oz °· What Counts as a serving? °¨ 1 slice bread. °¨ 1 oz ready-to-eat cereal. °¨ ½ cup cooked pasta, rice, or cereal. °Meat and Beans °· Breastfeeding: 6 ½ oz °· Non-Breastfeeding: 5 ½ oz °· What Counts as a serving? °¨ 1 oz lean meat, poultry, or fish °¨ ¼ cup cooked dry beans °¨ ½ oz nuts or 1 egg °¨ 1 tbs peanut butter °Milk °· Breastfeeding: 3 cups °· Non-Breastfeeding: 3 cups °· What Counts as a serving? °¨ 1 cup milk. °¨ 8 oz yogurt. °¨ 1 ½ oz cheese. °¨ 2 oz processed cheese. °TIPS FOR THE BREASTFEEDING MOM °· Rapid weight   loss is not suggested when you are breastfeeding. By simply breastfeeding, you will be able to lose the weight gained during your pregnancy. Your caregiver can keep track of your weight and tell you if your weight loss is appropriate.  Be sure to drink fluids. You may notice that you are thirstier than usual. A suggestion is to drink a glass of water or other beverage whenever you breastfeed.  Avoid alcohol as it can be passed into your breast milk.  Limit caffeine drinks to no more than 2 to 3 cups per day.  You may need to keep taking your prenatal vitamin while you are breastfeeding. Talk with your caregiver about taking a vitamin or supplement. RETURING TO A HEALTHY WEIGHT  The My Pyramid Plan for Moms will help you return to a healthy weight. It will also provide the nutrients you need.  You may need to limit "empty" calories. These include:  High fat foods like fried foods, fatty meats, fast food, butter, and mayonnaise.  High sugar foods like sodas, jelly, candy, and sweets.  Be physically active. Include 30 minutes of exercise or more each day. Choose an activity you like such as walking, swimming, biking, or aerobics. Check with your caregiver before you start to exercise. Document Released: 05/28/2007 Document Revised: 05/13/2011 Document Reviewed: 05/28/2007 [facility] Patient Information  2015 [place], [place]. This information is not intended to replace advice given to you by your health care provider. Make sure you discuss any questions you have with your health care provider. Postpartum Depression and Baby Blues The postpartum period begins right after the birth of a baby. During this time, there is often a great amount of joy and excitement. It is also a time of many changes in the life of the parents. Regardless of how many times a mother gives birth, each child brings new challenges and dynamics to the family. It is not unusual to have feelings of excitement along with confusing shifts in moods, emotions, and thoughts. All mothers are at risk of developing postpartum depression or the "baby blues." These mood changes can occur right after giving birth, or they may occur many months after giving birth. The baby blues or postpartum depression can be mild or severe. Additionally, postpartum depression can go away rather quickly, or it can be a long-term condition.  CAUSES Raised hormone levels and the rapid drop in those levels are thought to be a main cause of postpartum depression and the baby blues. A number of hormones change during and after pregnancy. Estrogen and progesterone usually decrease right after the delivery of your baby. The levels of thyroid hormone and various cortisol steroids also rapidly drop. Other factors that play a role in these mood changes include major life events and genetics.  RISK FACTORS If you have any of the following risks for the baby blues or postpartum depression, know what symptoms to watch out for during the postpartum period. Risk factors that may increase the likelihood of getting the baby blues or postpartum depression include:  Having a personal or family history of depression.   Having depression while being pregnant.   Having premenstrual mood issues or mood issues related to oral contraceptives.  Having a lot of life stress.   Having  marital conflict.   Lacking a social support network.   Having a baby with special needs.   Having health problems, such as diabetes.  SIGNS AND SYMPTOMS Symptoms of baby blues include:  Brief changes in mood, such as going  from extreme happiness to sadness.  Decreased concentration.   Difficulty sleeping.   Crying spells, tearfulness.   Irritability.   Anxiety.  Symptoms of postpartum depression typically begin within the first month after giving birth. These symptoms include:  Difficulty sleeping or excessive sleepiness.   Marked weight loss.   Agitation.   Feelings of worthlessness.   Lack of interest in activity or food.  Postpartum psychosis is a very serious condition and can be dangerous. Fortunately, it is rare. Displaying any of the following symptoms is cause for immediate medical attention. Symptoms of postpartum psychosis include:   Hallucinations and delusions.   Bizarre or disorganized behavior.   Confusion or disorientation.  DIAGNOSIS  A diagnosis is made by an evaluation of your symptoms. There are no medical or lab tests that lead to a diagnosis, but there are various questionnaires that a health care provider may use to identify those with the baby blues, postpartum depression, or psychosis. Often, a screening tool called the [place] Postnatal Depression Scale is used to diagnose depression in the postpartum period.  TREATMENT The baby blues usually goes away on its own in 1-2 weeks. Social support is often all that is needed. You will be encouraged to get adequate sleep and rest. Occasionally, you may be given medicines to help you sleep.  Postpartum depression requires treatment because it can last several months or longer if it is not treated. Treatment may include individual or group therapy, medicine, or both to address any social, physiological, and psychological factors that may play a role in the depression. Regular exercise, a  healthy diet, rest, and social support may also be strongly recommended.  Postpartum psychosis is more serious and needs treatment right away. Hospitalization is often needed. HOME CARE INSTRUCTIONS  Get as much rest as you can. Nap when the baby sleeps.   Exercise regularly. Some women find yoga and walking to be beneficial.   Eat a balanced and nourishing diet.   Do little things that you enjoy. Have a cup of tea, take a bubble bath, read your favorite magazine, or listen to your favorite music.  Avoid alcohol.   Ask for help with household chores, cooking, grocery shopping, or running errands as needed. Do not try to do everything.   Talk to people close to you about how you are feeling. Get support from your partner, family members, friends, or other new moms.  Try to stay positive in how you think. Think about the things you are grateful for.   Do not spend a lot of time alone.   Only take over-the-counter or prescription medicine as directed by your health care provider.  Keep all your postpartum appointments.   Let your health care provider know if you have any concerns.  SEEK MEDICAL CARE IF: You are having a reaction to or problems with your medicine. SEEK IMMEDIATE MEDICAL CARE IF:  You have suicidal feelings.   You think you may harm the baby or someone else. MAKE SURE YOU:  Understand these instructions.  Will watch your condition.  Will get help right away if you are not doing well or get worse. Document Released: 11/23/2003 Document Revised: 02/23/2013 Document Reviewed: 11/30/2012 [facility] Patient Information 2015 [place], [place]. This information is not intended to replace advice given to you by your health care provider. Make sure you discuss any questions you have with your health care provider. Cesarean Delivery, Care After Refer to this sheet in the next few weeks. These instructions provide  you with information on caring for yourself after  your procedure. Your health care provider may also give you specific instructions. Your treatment has been planned according to current medical practices, but problems sometimes occur. Call your health care provider if you have any problems or questions after you go home. HOME CARE INSTRUCTIONS  Only take over-the-counter or prescription medications as directed by your health care provider.  Do not drink alcohol, especially if you are breastfeeding or taking medication to relieve pain.  Do not chew or smoke tobacco.  Continue to use good perineal care. Good perineal care includes:  Wiping your perineum from front to back.  Keeping your perineum clean.  Check your surgical cut (incision) daily for increased redness, drainage, swelling, or separation of skin.  Clean your incision gently with soap and water every day, and then pat it dry. If your health care provider says it is okay, leave the incision uncovered. Use a bandage (dressing) if the incision is draining fluid or appears irritated. If the adhesive strips across the incision do not fall off within 7 days, carefully peel them off.  Hug a pillow when coughing or sneezing until your incision is healed. This helps to relieve pain.  Do not use tampons or douche until your health care provider says it is okay.  Shower, wash your hair, and take tub baths as directed by your health care provider.  Wear a well-fitting bra that provides breast support.  Limit wearing support panties or control-top hose.  Drink enough fluids to keep your urine clear or pale yellow.  Eat high-fiber foods such as whole grain cereals and breads, brown rice, beans, and fresh fruits and vegetables every day. These foods may help prevent or relieve constipation.  Resume activities such as climbing stairs, driving, lifting, exercising, or traveling as directed by your health care provider.  Talk to your health care provider about resuming sexual activities.  This is dependent upon your risk of infection, your rate of healing, and your comfort and desire to resume sexual activity.  Try to have someone help you with your household activities and your newborn for at least a few days after you leave the hospital.  Rest as much as possible. Try to rest or take a nap when your newborn is sleeping.  Increase your activities gradually.  Keep all of your scheduled postpartum appointments. It is very important to keep your scheduled follow-up appointments. At these appointments, your health care provider will be checking to make sure that you are healing physically and emotionally. SEEK MEDICAL CARE IF:   You are passing large clots from your vagina. Save any clots to show your health care provider.  You have a foul smelling discharge from your vagina.  You have trouble urinating.  You are urinating frequently.  You have pain when you urinate.  You have a change in your bowel movements.  You have increasing redness, pain, or swelling near your incision.  You have pus draining from your incision.  Your incision is separating.  You have painful, hard, or reddened breasts.  You have a severe headache.  You have blurred vision or see spots.  You feel sad or depressed.  You have thoughts of hurting yourself or your newborn.  You have questions about your care, the care of your newborn, or medications.  You are dizzy or light-headed.  You have a rash.  You have pain, redness, or swelling at the site of the removed intravenous access (IV) tube.  You have nausea or vomiting.  You stopped breastfeeding and have not had a menstrual period within 12 weeks of stopping.  You are not breastfeeding and have not had a menstrual period within 12 weeks of delivery.  You have a fever. SEEK IMMEDIATE MEDICAL CARE IF:  You have persistent pain.  You have chest pain.  You have shortness of breath.  You faint.  You have leg pain.  You  have stomach pain.  Your vaginal bleeding saturates 2 or more sanitary pads in 1 hour. MAKE SURE YOU:   Understand these instructions.  Will watch your condition.  Will get help right away if you are not doing well or get worse. Document Released: 11/10/2001 Document Revised: 07/05/2013 Document Reviewed: 10/16/2011 [facility] Patient Information 2015 [place], [place]. This information is not intended to replace advice given to you by your health care provider. Make sure you discuss any questions you have with your health care provider. Breastfeeding and Mastitis Mastitis is inflammation of the breast tissue. It can occur in women who are breastfeeding. This can make breastfeeding painful. Mastitis will sometimes go away on its own. Your health care provider will help determine if treatment is needed. CAUSES Mastitis is often associated with a blocked milk (lactiferous) duct. This can happen when too much milk builds up in the breast. Causes of excess milk in the breast can include:  Poor latch-on. If your baby is not latched onto the breast properly, she or he may not empty your breast completely while breastfeeding.  Allowing too much time to pass between feedings.  Wearing a bra or other clothing that is too tight. This puts extra pressure on the lactiferous ducts so milk does not flow through them as it should. Mastitis can also be caused by a bacterial infection. Bacteria may enter the breast tissue through cuts or openings in the skin. In women who are breastfeeding, this may occur because of cracked or irritated skin. Cracks in the skin are often caused when your baby does not latch on properly to the breast. SIGNS AND SYMPTOMS  Swelling, redness, tenderness, and pain in an area of the breast.  Swelling of the glands under the arm on the same side.  Fever may or may not accompany mastitis. If an infection is allowed to progress, a collection of pus (abscess) may develop. DIAGNOSIS   Your health care provider can usually diagnose mastitis based on your symptoms and a physical exam. Tests may be done to help confirm the diagnosis. These may include:  Removal of pus from the breast by applying pressure to the area. This pus can be examined in the lab to determine which bacteria are present. If an abscess has developed, the fluid in the abscess can be removed with a needle. This can also be used to confirm the diagnosis and determine the bacteria present. In most cases, pus will not be present.  Blood tests to determine if your body is fighting a bacterial infection.  Mammogram or ultrasound tests to rule out other problems or diseases. TREATMENT  Mastitis that occurs with breastfeeding will sometimes go away on its own. Your health care provider may choose to wait 24 hours after first seeing you to decide whether a prescription medicine is needed. If your symptoms are worse after 24 hours, your health care provider will likely prescribe an antibiotic medicine to treat the mastitis. He or she will determine which bacteria are most likely causing the infection and will then select an appropriate  antibiotic medicine. This is sometimes changed based on the results of tests performed to identify the bacteria, or if there is no response to the antibiotic medicine selected. Antibiotic medicines are usually given by mouth. You may also be given medicine for pain. HOME CARE INSTRUCTIONS  Only take over-the-counter or prescription medicines for pain, fever, or discomfort as directed by your health care provider.  If your health care provider prescribed an antibiotic medicine, take the medicine as directed. Make sure you finish it even if you start to feel better.  Do not wear a tight or underwire bra. Wear a soft, supportive bra.  Increase your fluid intake, especially if you have a fever.  Continue to empty the breast. Your health care provider can tell you whether this milk is safe  for your infant or needs to be thrown out. You may be told to stop nursing until your health care provider thinks it is safe for your baby. Use a breast pump if you are advised to stop nursing.  Keep your nipples clean and dry.  Empty the first breast completely before going to the other breast. If your baby is not emptying your breasts completely for some reason, use a breast pump to empty your breasts.  If you go back to work, pump your breasts while at work to stay in time with your nursing schedule.  Avoid allowing your breasts to become overly filled with milk (engorged). SEEK MEDICAL CARE IF:  You have pus-like discharge from the breast.  Your symptoms do not improve with the treatment prescribed by your health care provider within 2 days. SEEK IMMEDIATE MEDICAL CARE IF:  Your pain and swelling are getting worse.  You have pain that is not controlled with medicine.  You have a red line extending from the breast toward your armpit.  You have a fever or persistent symptoms for more than 2-3 days.  You have a fever and your symptoms suddenly get worse. MAKE SURE YOU:   Understand these instructions.  Will watch your condition.  Will get help right away if you are not doing well or get worse. Document Released: 06/15/2004 Document Revised: 02/23/2013 Document Reviewed: 09/24/2012 [facility] Patient Information 2015 [place], [place]. This information is not intended to replace advice given to you by your health care provider. Make sure you discuss any questions you have with your health care provider. Breastfeeding Deciding to breastfeed is one of the best choices you can make for you and your baby. A change in hormones during pregnancy causes your breast tissue to grow and increases the number and size of your milk ducts. These hormones also allow proteins, sugars, and fats from your blood supply to make breast milk in your milk-producing glands. Hormones prevent breast milk from  being released before your baby is born as well as prompt milk flow after birth. Once breastfeeding has begun, thoughts of your baby, as well as his or her sucking or crying, can stimulate the release of milk from your milk-producing glands.  BENEFITS OF BREASTFEEDING For Your Baby  Your first milk (colostrum) helps your baby's digestive system function better.   There are antibodies in your milk that help your baby fight off infections.   Your baby has a lower incidence of asthma, allergies, and sudden infant death syndrome.   The nutrients in breast milk are better for your baby than infant formulas and are designed uniquely for your baby's needs.   Breast milk improves your baby's brain development.  Your baby is less likely to develop other conditions, such as childhood obesity, asthma, or type 2 diabetes mellitus.  For You   Breastfeeding helps to create a very special bond between you and your baby.   Breastfeeding is convenient. Breast milk is always available at the correct temperature and costs nothing.   Breastfeeding helps to burn calories and helps you lose the weight gained during pregnancy.   Breastfeeding makes your uterus contract to its prepregnancy size faster and slows bleeding (lochia) after you give birth.   Breastfeeding helps to lower your risk of developing type 2 diabetes mellitus, osteoporosis, and breast or ovarian cancer later in life. SIGNS THAT YOUR BABY IS HUNGRY Early Signs of Hunger  Increased alertness or activity.  Stretching.  Movement of the head from side to side.  Movement of the head and opening of the mouth when the corner of the mouth or cheek is stroked (rooting).  Increased sucking sounds, smacking lips, cooing, sighing, or squeaking.  Hand-to-mouth movements.  Increased sucking of fingers or hands. Late Signs of Hunger  Fussing.  Intermittent crying. Extreme Signs of Hunger Signs of extreme hunger will require  calming and consoling before your baby will be able to breastfeed successfully. Do not wait for the following signs of extreme hunger to occur before you initiate breastfeeding:   Restlessness.  A loud, strong cry.   Screaming. BREASTFEEDING BASICS Breastfeeding Initiation  Find a comfortable place to sit or lie down, with your neck and back well supported.  Place a pillow or rolled up blanket under your baby to bring him or her to the level of your breast (if you are seated). Nursing pillows are specially designed to help support your arms and your baby while you breastfeed.  Make sure that your baby's abdomen is facing your abdomen.   Gently massage your breast. With your fingertips, massage from your chest wall toward your nipple in a circular motion. This encourages milk flow. You may need to continue this action during the feeding if your milk flows slowly.  Support your breast with 4 fingers underneath and your thumb above your nipple. Make sure your fingers are well away from your nipple and your baby's mouth.   Stroke your baby's lips gently with your finger or nipple.   When your baby's mouth is open wide enough, quickly bring your baby to your breast, placing your entire nipple and as much of the colored area around your nipple (areola) as possible into your baby's mouth.   More areola should be visible above your baby's upper lip than below the lower lip.   Your baby's tongue should be between his or her lower gum and your breast.   Ensure that your baby's mouth is correctly positioned around your nipple (latched). Your baby's lips should create a seal on your breast and be turned out (everted).  It is common for your baby to suck about 2-3 minutes in order to start the flow of breast milk. Latching Teaching your baby how to latch on to your breast properly is very important. An improper latch can cause nipple pain and decreased milk supply for you and poor weight  gain in your baby. Also, if your baby is not latched onto your nipple properly, he or she may swallow some air during feeding. This can make your baby fussy. Burping your baby when you switch breasts during the feeding can help to get rid of the air. However, teaching your baby to latch  on properly is still the best way to prevent fussiness from swallowing air while breastfeeding. Signs that your baby has successfully latched on to your nipple:    Silent tugging or silent sucking, without causing you pain.   Swallowing heard between every 3-4 sucks.    Muscle movement above and in front of his or her ears while sucking.  Signs that your baby has not successfully latched on to nipple:   Sucking sounds or smacking sounds from your baby while breastfeeding.  Nipple pain. If you think your baby has not latched on correctly, slip your finger into the corner of your baby's mouth to break the suction and place it between your baby's gums. Attempt breastfeeding initiation again. Signs of Successful Breastfeeding Signs from your baby:   A gradual decrease in the number of sucks or complete cessation of sucking.   Falling asleep.   Relaxation of his or her body.   Retention of a small amount of milk in his or her mouth.   Letting go of your breast by himself or herself. Signs from you:  Breasts that have increased in firmness, weight, and size 1-3 hours after feeding.   Breasts that are softer immediately after breastfeeding.  Increased milk volume, as well as a change in milk consistency and color by the fifth day of breastfeeding.   Nipples that are not sore, cracked, or bleeding. Signs That Your [employer] is Getting Enough Milk  Wetting at least 3 diapers in a 24-hour period. The urine should be clear and pale yellow by age 102 days.  At least 3 stools in a 24-hour period by age 102 days. The stool should be soft and yellow.  At least 3 stools in a 24-hour period by age 37 days.  The stool should be seedy and yellow.  No loss of weight greater than 10% of birth weight during the first 45 days of age.  Average weight gain of 4-7 ounces (113-198 g) per week after age 3 days.  Consistent daily weight gain by age 39 days, without weight loss after the age of 2 weeks. After a feeding, your baby may spit up a small amount. This is common. BREASTFEEDING FREQUENCY AND DURATION Frequent feeding will help you make more milk and can prevent sore nipples and breast engorgement. Breastfeed when you feel the need to reduce the fullness of your breasts or when your baby shows signs of hunger. This is called "breastfeeding on demand." Avoid introducing a pacifier to your baby while you are working to establish breastfeeding (the first 4-6 weeks after your baby is born). After this time you may choose to use a pacifier. Research has shown that pacifier use during the first year of a baby's life decreases the risk of sudden infant death syndrome (SIDS). Allow your baby to feed on each breast as long as he or she wants. Breastfeed until your baby is finished feeding. When your baby unlatches or falls asleep while feeding from the first breast, offer the second breast. Because newborns are often sleepy in the first few weeks of life, you may need to awaken your baby to get him or her to feed. Breastfeeding times will vary from baby to baby. However, the following rules can serve as a guide to help you ensure that your baby is properly fed:  Newborns (babies 95 weeks of age or younger) may breastfeed every 1-3 hours.  Newborns should not go longer than 3 hours during the day or 5 hours  during the night without breastfeeding.  You should breastfeed your baby a minimum of 8 times in a 24-hour period until you begin to introduce solid foods to your baby at around 9 months of age. BREAST MILK PUMPING Pumping and storing breast milk allows you to ensure that your baby is exclusively fed your breast  milk, even at times when you are unable to breastfeed. This is especially important if you are going back to work while you are still breastfeeding or when you are not able to be present during feedings. Your lactation consultant can give you guidelines on how long it is safe to store breast milk.  A breast pump is a machine that allows you to pump milk from your breast into a sterile bottle. The pumped breast milk can then be stored in a refrigerator or freezer. Some breast pumps are operated by hand, while others use electricity. Ask your lactation consultant which type will work best for you. Breast pumps can be purchased, but some hospitals and breastfeeding support groups lease breast pumps on a monthly basis. A lactation consultant can teach you how to hand express breast milk, if you prefer not to use a pump.  CARING FOR YOUR BREASTS WHILE YOU BREASTFEED Nipples can become dry, cracked, and sore while breastfeeding. The following recommendations can help keep your breasts moisturized and healthy:  Avoid using soap on your nipples.   Wear a supportive bra. Although not required, special nursing bras and tank tops are designed to allow access to your breasts for breastfeeding without taking off your entire bra or top. Avoid wearing underwire-style bras or extremely tight bras.  Air dry your nipples for 3-21minutes after each feeding.   Use only cotton bra pads to absorb leaked breast milk. Leaking of breast milk between feedings is normal.   Use lanolin on your nipples after breastfeeding. Lanolin helps to maintain your skin's normal moisture barrier. If you use pure lanolin, you do not need to wash it off before feeding your baby again. Pure lanolin is not toxic to your baby. You may also hand express a few drops of breast milk and gently massage that milk into your nipples and allow the milk to air dry. In the first few weeks after giving birth, some women experience extremely full breasts  (engorgement). Engorgement can make your breasts feel heavy, warm, and tender to the touch. Engorgement peaks within 3-5 days after you give birth. The following recommendations can help ease engorgement:  Completely empty your breasts while breastfeeding or pumping. You may want to start by applying warm, moist heat (in the shower or with warm water-soaked hand towels) just before feeding or pumping. This increases circulation and helps the milk flow. If your baby does not completely empty your breasts while breastfeeding, pump any extra milk after he or she is finished.  Wear a snug bra (nursing or regular) or tank top for 1-2 days to signal your body to slightly decrease milk production.  Apply ice packs to your breasts, unless this is too uncomfortable for you.  Make sure that your baby is latched on and positioned properly while breastfeeding. If engorgement persists after 48 hours of following these recommendations, contact your health care provider or a Science writer. OVERALL HEALTH CARE RECOMMENDATIONS WHILE BREASTFEEDING  Eat healthy foods. Alternate between meals and snacks, eating 3 of each per day. Because what you eat affects your breast milk, some of the foods may make your baby more irritable than usual. Avoid  eating these foods if you are sure that they are negatively affecting your baby.  Drink milk, fruit juice, and water to satisfy your thirst (about 10 glasses a day).   Rest often, relax, and continue to take your prenatal vitamins to prevent fatigue, stress, and anemia.  Continue breast self-awareness checks.  Avoid chewing and smoking tobacco.  Avoid alcohol and drug use. Some medicines that may be harmful to your baby can pass through breast milk. It is important to ask your health care provider before taking any medicine, including all over-the-counter and prescription medicine as well as vitamin and herbal supplements. It is possible to become pregnant while  breastfeeding. If birth control is desired, ask your health care provider about options that will be safe for your baby. SEEK MEDICAL CARE IF:   You feel like you want to stop breastfeeding or have become frustrated with breastfeeding.  You have painful breasts or nipples.  Your nipples are cracked or bleeding.  Your breasts are red, tender, or warm.  You have a swollen area on either breast.  You have a fever or chills.  You have nausea or vomiting.  You have drainage other than breast milk from your nipples.  Your breasts do not become full before feedings by the fifth day after you give birth.  You feel sad and depressed.  Your baby is too sleepy to eat well.  Your baby is having trouble sleeping.   Your baby is wetting less than 3 diapers in a 24-hour period.  Your baby has less than 3 stools in a 24-hour period.  Your baby's skin or the white part of his or her eyes becomes yellow.   Your baby is not gaining weight by 23 days of age. SEEK IMMEDIATE MEDICAL CARE IF:   Your baby is overly tired (lethargic) and does not want to wake up and feed.  Your baby develops an unexplained fever. Document Released: 02/18/2005 Document Revised: 02/23/2013 Document Reviewed: 08/12/2012 [facility] Patient Information 2015 [place], [place]. This information is not intended to replace advice given to you by your health care provider. Make sure you discuss any questions you have with your health care provider.

## 2014-08-29 NOTE — Progress Notes (Signed)
Patient ID: Grace Moore, female   DOB: [birth year], 29 y.o.   MRN: [id number] Subjective: S/P Repeat Cesarean Delivery with Bilateral Tubal Ligation POD# 2 Information for the patient's newborn:  Grace Moore, Grace Moore [[id number]]  female  / circ done  Reports feeling very tired, but ready to go home Feeding: bottle Patient reports tolerating PO.  Breast symptoms: none Pain controlled with ibuprofen (OTC) and narcotic analgesics including Percocet Denies HA/SOB/C/P/N/V/dizziness. Flatus present. (+) BM. She reports vaginal bleeding as normal, without clots.  She is ambulating, urinating without difficult.     Objective:   VS:  Filed Vitals:   08/28/14 0350 08/28/14 0800 08/28/14 1845 08/29/14 0634  BP: 95/48 116/54 115/63 121/62  Pulse: 63 65 66 67  Temp: 97.9 F (36.6 C) 98.6 F (37 C) 98.5 F (36.9 C) 97.9 F (36.6 C)  TempSrc: Oral Oral Oral Oral  Resp: 18 18 18 18   SpO2: 96%           Recent Labs  08/28/14 0533  WBC 13.7*  HGB 9.4*  HCT 29.6*  PLT 320     Blood type: A NEG (06/26 0533) / Infant Rh Pos - Rhophylac given 6/26  Rubella: Immune (11/30 0000)     Physical Exam:  General: alert, cooperative, no distress and mildly obese Abdomen: soft, nontender, normal bowel sounds Incision: clean, dry and intact Uterine Fundus: firm, 2 FB below umbilicus, nontender Lochia: minimal Ext: edema trace and Homans sign is negative, no sign of DVT   Assessment/Plan: 29 y.o.   POD# 2.  s/p Cesarean Delivery.  Indications: Repeat with BTL                Principal Problem:   Postpartum care following cesarean delivery (6/25) Active Problems:   Status post repeat low transverse cesarean section & BTL   Iron deficiency anemia of pregnancy   Blood loss anemia   Rh negative in pregnancy  Doing well, stable.               Regular diet as tolerated Ambulate Routine post-op care Desires early discharge home today  Grace Congress, MSN, CNM 08/29/2014, 9:06  AM

## 2014-08-30 ENCOUNTER — Encounter (HOSPITAL_COMMUNITY): Payer: Self-pay | Admitting: *Deleted

## 2015-04-20 ENCOUNTER — Encounter: Payer: Self-pay | Admitting: Family Medicine

## 2015-04-20 ENCOUNTER — Ambulatory Visit (INDEPENDENT_AMBULATORY_CARE_PROVIDER_SITE_OTHER): Payer: 59 | Admitting: Family Medicine

## 2015-04-20 VITALS — BP 130/72 | HR 74 | Temp 98.6°F | Resp 16 | Wt 220.0 lb

## 2015-04-20 DIAGNOSIS — J019 Acute sinusitis, unspecified: Secondary | ICD-10-CM | POA: Diagnosis not present

## 2015-04-20 DIAGNOSIS — B9689 Other specified bacterial agents as the cause of diseases classified elsewhere: Secondary | ICD-10-CM

## 2015-04-20 MED ORDER — FLUCONAZOLE 150 MG PO TABS
150.0000 mg | ORAL_TABLET | Freq: Once | ORAL | Status: DC
Start: 2015-04-20 — End: 2015-11-09

## 2015-04-20 MED ORDER — AMOXICILLIN-POT CLAVULANATE 875-125 MG PO TABS: 1.0000 | ORAL_TABLET | Freq: Two times a day (BID) | ORAL | Status: DC

## 2015-04-20 NOTE — Progress Notes (Signed)
   Subjective:    Patient ID: Grace Moore, female    DOB: 1985/06/26, 30 y.o.   MRN: [id number]  HPI Symptoms began 8 days ago. It started with a viral upper respiratory infection. The patient try to treat conservatively with Sudafed, Afrin, nasal saline, and tincture of time. However over the last several days she has developed severe pain in her frontal and maxillary sinuses bilaterally. Her cheekbones hurt. Her teeth hurt. She is having a dull constant headache. The fever Past Medical History  Diagnosis Date  . GERD (gastroesophageal reflux disease)   . History of kidney stones     passed stone, no surgery  . Headache     no med, lay down, sleep   Current Outpatient Prescriptions on File Prior to Visit  Medication Sig Dispense Refill  . cholecalciferol (VITAMIN D) 1000 UNITS tablet Take 1,000 Units by mouth daily.    Marland Kitchen esomeprazole (NEXIUM) 20 MG capsule Take 20 mg by mouth daily at 12 noon.    Marland Kitchen ibuprofen (ADVIL,MOTRIN) 600 MG tablet Take 1 tablet (600 mg total) by mouth every 6 (six) hours. 30 tablet 0   No current facility-administered medications on file prior to visit.   No Known Allergies Social History   Social History  . Marital Status: Married    Spouse Name: N/A  . Number of Children: N/A  . Years of Education: N/A   Occupational History  . Not on file.   Social History Main Topics  . Smoking status: Never Smoker   . Smokeless tobacco: Never Used  . Alcohol Use: No  . Drug Use: No  . Sexual Activity: Yes    Birth Control/ Protection: None     Comment: pregnant   Other Topics Concern  . Not on file   Social History Narrative  No longer pregnant, has had a tubal ligation. No family history on file.   Review of Systems  All other systems reviewed and are negative.      Objective:   Physical Exam  Constitutional: She is oriented to person, place, and time. She appears well-nourished.  HENT:  Right Ear: External ear normal.  Left Ear:  External ear normal.  Nose: Mucosal edema and rhinorrhea present. Right sinus exhibits maxillary sinus tenderness and frontal sinus tenderness. Left sinus exhibits maxillary sinus tenderness and frontal sinus tenderness.  Mouth/Throat: Oropharynx is clear and moist. No oropharyngeal exudate.  Neck: Neck supple.  Cardiovascular: Normal rate, regular rhythm and normal heart sounds.   No murmur heard. Pulmonary/Chest: Effort normal and breath sounds normal. No respiratory distress. She has no wheezes. She has no rales.  Abdominal: Soft. Bowel sounds are normal. She exhibits no distension. There is no tenderness. There is no rebound and no guarding.  Musculoskeletal: She exhibits no edema.  Lymphadenopathy:    She has no cervical adenopathy.  Neurological: She is alert and oriented to person, place, and time.  Vitals reviewed.         Assessment & Plan:  Acute bacterial rhinosinusitis - Plan: amoxicillin-clavulanate (AUGMENTIN) 875-125 MG tablet, fluconazole (DIFLUCAN) 150 MG tablet  Patient originally had a viral upper respiratory infection but I do believe his turn into a secondary bacterial sinusitis. Begin Augmentin 875 mg by mouth twice a day for 10 days. I did give her Diflucan that she can use in case she develops a yeast infection. Continue nasal saline.

## 2015-11-09 ENCOUNTER — Ambulatory Visit (INDEPENDENT_AMBULATORY_CARE_PROVIDER_SITE_OTHER): Payer: 59 | Admitting: Family Medicine

## 2015-11-09 ENCOUNTER — Encounter: Payer: Self-pay | Admitting: Family Medicine

## 2015-11-09 VITALS — BP 120/82 | HR 98 | Temp 98.7°F | Resp 16 | Wt 258.0 lb

## 2015-11-09 DIAGNOSIS — Z23 Encounter for immunization: Secondary | ICD-10-CM

## 2015-11-09 DIAGNOSIS — D485 Neoplasm of uncertain behavior of skin: Secondary | ICD-10-CM | POA: Diagnosis not present

## 2015-11-09 NOTE — Addendum Note (Signed)
Addended by: Shary Decamp B on: 11/09/2015 05:14 PM   Modules accepted: Orders

## 2015-11-09 NOTE — Progress Notes (Signed)
   Subjective:    Patient ID: Grace Moore, female    DOB: 09-13-1985, 30 y.o.   MRN: [id number]  HPI Patient has a suspicious tender mole on the left side of her neck just below the angle of the mandible. Is approximately 3 mm in diameter. It is dark black and irritated. It is sore to the touch. She is not sure how long it's been there. She is requesting a shave biopsy Past Medical History:  Diagnosis Date  . GERD (gastroesophageal reflux disease)   . Headache    no med, lay down, sleep  . History of kidney stones    passed stone, no surgery   Past Surgical History:  Procedure Laterality Date  . CESAREAN SECTION  11/2009   [place]  . CESAREAN SECTION WITH BILATERAL TUBAL LIGATION Bilateral 08/27/2014   Procedure: REPEAT CESAREAN SECTION WITH BILATERAL TUBAL LIGATION ;  Surgeon: Aloha Gell, MD;  Location: [facility];  Service: Obstetrics;  Laterality: Bilateral;  EDD: 09/03/14  . CHOLECYSTECTOMY    . WISDOM TOOTH EXTRACTION    . WRIST SURGERY     x 2 one left and 1 right    Current Outpatient Prescriptions on File Prior to Visit  Medication Sig Dispense Refill  . cholecalciferol (VITAMIN D) 1000 UNITS tablet Take 1,000 Units by mouth daily.    Marland Kitchen esomeprazole (NEXIUM) 20 MG capsule Take 20 mg by mouth daily at 12 noon.    Marland Kitchen ibuprofen (ADVIL,MOTRIN) 600 MG tablet Take 1 tablet (600 mg total) by mouth every 6 (six) hours. 30 tablet 0   No current facility-administered medications on file prior to visit.    No Known Allergies Social History   Social History  . Marital status: Married    Spouse name: N/A  . Number of children: N/A  . Years of education: N/A   Occupational History  . Not on file.   Social History Main Topics  . Smoking status: Never Smoker  . Smokeless tobacco: Never Used  . Alcohol use No  . Drug use: No  . Sexual activity: Yes    Birth control/ protection: None     Comment: pregnant   Other Topics Concern  . Not on file   Social History Narrative  .  No narrative on file      Review of Systems  All other systems reviewed and are negative.      Objective:   Physical Exam  Cardiovascular: Normal rate and regular rhythm.   Pulmonary/Chest: Effort normal and breath sounds normal.  Vitals reviewed. see hpi        Assessment & Plan:  Neoplasm of uncertain behavior of skin - Plan: Pathology  Lesion is likely an irritated skin tag versus an irritated small nevus. However we will perform a shave biopsy to rule out pathologic lesion. Lesion was anesthetized with 0.1% lidocaine with epinephrine. Shave biopsy was performed and the lesion was sent to pathology and labeled container. Hemostasis was achieved with Drysol and a Band-Aid.

## 2015-11-10 LAB — PATHOLOGY

## 2016-03-11 ENCOUNTER — Ambulatory Visit (INDEPENDENT_AMBULATORY_CARE_PROVIDER_SITE_OTHER): Payer: BLUE CROSS/BLUE SHIELD | Admitting: Family Medicine

## 2016-03-11 VITALS — BP 144/98 | HR 98 | Temp 98.7°F | Resp 16 | Ht 63.0 in | Wt 266.0 lb

## 2016-03-11 DIAGNOSIS — K219 Gastro-esophageal reflux disease without esophagitis: Secondary | ICD-10-CM

## 2016-03-11 LAB — COMPLETE METABOLIC PANEL WITH GFR
ALT: 20 U/L (ref 6–29)
AST: 21 U/L (ref 10–30)
Albumin: 3.9 g/dL (ref 3.6–5.1)
Alkaline Phosphatase: 70 U/L (ref 33–115)
BILIRUBIN TOTAL: 0.4 mg/dL (ref 0.2–1.2)
BUN: 12 mg/dL (ref 7–25)
CALCIUM: 8.6 mg/dL (ref 8.6–10.2)
CO2: 23 mmol/L (ref 20–31)
Chloride: 105 mmol/L (ref 98–110)
Creat: 0.65 mg/dL (ref 0.50–1.10)
GFR, Est African American: >89 mL/min (ref >=60)
GFR, Est Non African American: >89 mL/min (ref >=60)
Glucose, Bld: 87 mg/dL (ref 70–99)
Potassium: 4.2 mmol/L (ref 3.5–5.3)
SODIUM: 138 mmol/L (ref 135–146)
TOTAL PROTEIN: 6.9 g/dL (ref 6.1–8.1)

## 2016-03-11 LAB — CBC WITH DIFFERENTIAL/PLATELET
Basophils Absolute: 0 cells/uL (ref 0–200)
Basophils Relative: 0 %
EOS ABS: 64 {cells}/uL (ref 15–500)
EOS PCT: 1 %
HCT: 37.4 % (ref 35.0–45.0)
Hemoglobin: 12 g/dL (ref 12.0–15.0)
Lymphocytes Relative: 33 %
Lymphs Abs: 2112 cells/uL (ref 850–3900)
MCH: 25.8 pg — ABNORMAL LOW (ref 27.0–33.0)
MCHC: 32.1 g/dL (ref 32.0–36.0)
MCV: 80.4 fL (ref 80.0–100.0)
MONO ABS: 768 {cells}/uL (ref 200–950)
MPV: 9.2 fL (ref 7.5–12.5)
Monocytes Relative: 12 %
NEUTROS ABS: 3456 {cells}/uL (ref 1500–7800)
NEUTROS PCT: 54 %
Platelets: 349 10*3/uL (ref 140–400)
RBC: 4.65 MIL/uL (ref 3.80–5.10)
RDW: 16 % — ABNORMAL HIGH (ref 11.0–15.0)
WBC: 6.4 10*3/uL (ref 3.8–10.8)

## 2016-03-11 LAB — LIPASE: LIPASE: 35 U/L (ref 7–60)

## 2016-03-11 MED ORDER — SUCRALFATE 1 G PO TABS: 1.0000 g | ORAL_TABLET | Freq: Three times a day (TID) | ORAL | 0 refills | Status: DC

## 2016-03-11 NOTE — Progress Notes (Signed)
Subjective:    Patient ID: Grace Moore, female    DOB: [birth year], 31 y.o.   MRN: [id number]  HPI Has been having severe heartburn for several months. She is taking 20 mg Nexium over-the-counter with no relief. She denies any melena or black tarry stools. She denies any hematochezia. She denies any fevers or weight loss. Her gallbladder has been removed. She does not drink any alcohol. She has no family history of pancreatic disease. She has no family history of ulcers. She reports a constant burning pain in the substernal area and in the left upper quadrant. At times it keeps her from eating. Past Medical History:  Diagnosis Date  . GERD (gastroesophageal reflux disease)   . Headache    no med, lay down, sleep  . History of kidney stones    passed stone, no surgery   Past Surgical History:  Procedure Laterality Date  . CESAREAN SECTION  11/2009   [place]  . CESAREAN SECTION WITH BILATERAL TUBAL LIGATION Bilateral 08/27/2014   Procedure: REPEAT CESAREAN SECTION WITH BILATERAL TUBAL LIGATION ;  Surgeon: Aloha Gell, MD;  Location: [facility];  Service: Obstetrics;  Laterality: Bilateral;  EDD: 09/03/14  . CHOLECYSTECTOMY    . WISDOM TOOTH EXTRACTION    . WRIST SURGERY     x 2 one left and 1 right    Current Outpatient Prescriptions on File Prior to Visit  Medication Sig Dispense Refill  . cholecalciferol (VITAMIN D) 1000 UNITS tablet Take 1,000 Units by mouth daily.    Marland Kitchen esomeprazole (NEXIUM) 20 MG capsule Take 20 mg by mouth daily at 12 noon.    Marland Kitchen ibuprofen (ADVIL,MOTRIN) 600 MG tablet Take 1 tablet (600 mg total) by mouth every 6 (six) hours. 30 tablet 0   No current facility-administered medications on file prior to visit.    No Known Allergies Social History   Social History  . Marital status: Married    Spouse name: N/A  . Number of children: N/A  . Years of education: N/A   Occupational History  . Not on file.   Social History Main Topics  . Smoking status: Never  Smoker  . Smokeless tobacco: Never Used  . Alcohol use No  . Drug use: No  . Sexual activity: Yes    Birth control/ protection: None     Comment: pregnant   Other Topics Concern  . Not on file   Social History Narrative  . No narrative on file       Review of Systems  All other systems reviewed and are negative.      Objective:   Physical Exam  Cardiovascular: Normal rate, regular rhythm and normal heart sounds.   Pulmonary/Chest: Effort normal and breath sounds normal. No respiratory distress. She has no wheezes. She has no rales.  Abdominal: Soft. Bowel sounds are normal. She exhibits no distension. There is no tenderness. There is no rebound and no guarding.  Vitals reviewed.         Assessment & Plan:  Gastroesophageal reflux disease without esophagitis - Plan: H. pylori breath test, sucralfate (CARAFATE) 1 g tablet, CBC with Differential/Platelet, COMPLETE METABOLIC PANEL WITH GFR, Lipase Discontinue Nexium and replaced with dexilant 60 mg by mouth every morning with breakfast. Add Carafate 1 g by mouth every before meals at bedtime. Check breath test for H. pylori. Also check CBC, CMP, and a lipase. Await the results of laboratory studies. Differential diagnosis includes H pylori versus gastritis versus refractory GERD

## 2016-03-12 ENCOUNTER — Encounter: Payer: Self-pay | Admitting: Family Medicine

## 2016-03-12 LAB — H. PYLORI BREATH TEST: H. pylori Breath Test: NOT DETECTED

## 2016-03-20 ENCOUNTER — Encounter: Payer: Self-pay | Admitting: Family Medicine

## 2016-03-22 MED ORDER — DEXLANSOPRAZOLE 60 MG PO CPDR: 60.0000 mg | DELAYED_RELEASE_CAPSULE | Freq: Every day | ORAL | 3 refills | Status: DC

## 2016-03-25 ENCOUNTER — Telehealth: Payer: Self-pay | Admitting: Family Medicine

## 2016-03-25 MED ORDER — DEXLANSOPRAZOLE 60 MG PO CPDR: 60.0000 mg | DELAYED_RELEASE_CAPSULE | Freq: Every day | ORAL | 3 refills | Status: DC

## 2016-03-25 NOTE — Telephone Encounter (Signed)
Approved;Coverage Start Date:03/04/2016;Coverage End Date:04/03/2017 and pt aware via mychart

## 2016-03-25 NOTE — Telephone Encounter (Signed)
PA submitted for Dexilant through [email or web address]   This request has been approved.

## 2016-03-26 MED ORDER — PANTOPRAZOLE SODIUM 40 MG PO TBEC: 40.0000 mg | DELAYED_RELEASE_TABLET | Freq: Two times a day (BID) | ORAL | 11 refills | Status: DC

## 2016-03-26 NOTE — Addendum Note (Signed)
Addended by: Shary Decamp B on: 03/26/2016 04:09 PM   Modules accepted: Orders

## 2016-05-03 ENCOUNTER — Encounter: Payer: Self-pay | Admitting: Family Medicine

## 2016-05-03 MED ORDER — PANTOPRAZOLE SODIUM 40 MG PO TBEC: 40.0000 mg | DELAYED_RELEASE_TABLET | Freq: Two times a day (BID) | ORAL | 3 refills | Status: DC

## 2016-11-21 ENCOUNTER — Ambulatory Visit (INDEPENDENT_AMBULATORY_CARE_PROVIDER_SITE_OTHER): Payer: BLUE CROSS/BLUE SHIELD | Admitting: Physician Assistant

## 2016-11-21 ENCOUNTER — Encounter: Payer: Self-pay | Admitting: Physician Assistant

## 2016-11-21 VITALS — BP 118/88 | HR 90 | Temp 98.6°F | Resp 14 | Ht 63.0 in | Wt 250.4 lb

## 2016-11-21 DIAGNOSIS — J029 Acute pharyngitis, unspecified: Secondary | ICD-10-CM

## 2016-11-21 MED ORDER — AMOXICILLIN-POT CLAVULANATE 875-125 MG PO TABS: 1.0000 | ORAL_TABLET | Freq: Two times a day (BID) | ORAL | 0 refills | Status: AC

## 2016-11-21 NOTE — Progress Notes (Signed)
Patient ID: Grace Moore MRN: [id number], DOB: 03/24/1985, 31 y.o. Date of Encounter: 11/21/2016, 10:22 AM    Chief Complaint:  Chief Complaint  Patient presents with  . Sore Throat    tue went to urgent care      HPI: 30 y.o. year old female presents with above.   She reports that on Tuesday 11/19/16 she went to urgent care. Says that at that time her temperature was 104. Says they did a swab for strep test and flu and both of these were negative. However says that they told her that they did see white spots in her throat so they went ahead and prescribed amoxicillin.  She reports that she has been taking the amoxicillin as directed. Has taken 5 doses so far.  States that her fever resolved yesterday morning. However says that her throat is still very sore.  Says that she took her daughter to her pediatrician this morning and she tested positive for strep. Patient states that she mentioned to the pediatrician about her urgent care visit etc. and the fact that she was not feeling better and they said that she should be feeling better by now so that is why she scheduled this appointment and came here for follow-up.  States that she has been eating oatmeal jello ice cream etc. Says that her throat is still very sore. Has had no nasal congestion or mucus from the nose. Has had no cough or chest congestion. As above her fever has resolved yesterday morning.     Home Meds:   Outpatient Medications Prior to Visit  Medication Sig Dispense Refill  . pantoprazole (PROTONIX) 40 MG tablet Take 1 tablet (40 mg total) by mouth 2 (two) times daily. 180 tablet 3  . cholecalciferol (VITAMIN D) 1000 UNITS tablet Take 1,000 Units by mouth daily.    Marland Kitchen esomeprazole (NEXIUM) 20 MG capsule Take 20 mg by mouth daily at 12 noon.    Marland Kitchen ibuprofen (ADVIL,MOTRIN) 600 MG tablet Take 1 tablet (600 mg total) by mouth every 6 (six) hours. 30 tablet 0  . sucralfate (CARAFATE) 1 g tablet Take 1 tablet (1 g  total) by mouth 4 (four) times daily -  with meals and at bedtime. 120 tablet 0   No facility-administered medications prior to visit.     Allergies: No Known Allergies    Review of Systems: See HPI for pertinent ROS. All other ROS negative.    Physical Exam: Blood pressure 118/88, pulse 90, temperature 98.6 F (37 C), temperature source Oral, resp. rate 14, height 5\' 3"  (1.6 m), weight 113.6 kg (250 lb 6.4 oz), last menstrual period 11/02/2016, SpO2 98 %, unknown if currently breastfeeding., Body mass index is 44.36 kg/m. General: WF.  Appears in no acute distress. HEENT: Normocephalic, atraumatic, eyes without discharge, sclera non-icteric, nares are without discharge. Bilateral auditory canals clear, TM's are without perforation, pearly grey and translucent with reflective cone of light bilaterally. Oral cavity moist, posterior pharynx with moderate erythema. No exudate visualized on exam today. No  peritonsillar abscess. Neck: Supple. No thyromegaly. She reports tenderness with palpation of tonsillar and anterior cervical nodes. I'm unable to palpate any enlarged nodes. Lungs: Clear bilaterally to auscultation without wheezes, rales, or rhonchi. Breathing is unlabored. Heart: Regular rhythm. No murmurs, rubs, or gallops. Msk:  Strength and tone normal for age. Extremities/Skin: Warm and dry.  Neuro: Alert and oriented X 3. Moves all extremities spontaneously. Gait is normal. CNII-XII grossly in tact. Psych:  Responds to questions appropriately with a normal affect.     ASSESSMENT AND PLAN:  31 y.o. year old female with  1. Acute pharyngitis, unspecified etiology Honestly, I think that she just hasn't been on the amoxicillin long enough for her symptoms to have resolved. Her fever has resolved and her exam does appear that it is improved compared to what she says the urgent care reported seeing on Tuesday. I think that the amoxicillin is starting to kill the infection therefore the  fever has resolved and her symptoms will be improving over the upcoming days.  However, given what the pediatrician told her this morning -----will upgrade the amoxicillin to Augmentin. Is to take this as directed and complete all of it. Also recommended for her to use some lozenges to have some numbing medicine in them to help decrease the pain. Also can continue taking Tylenol and Motrin to decrease the pain. Offered magic mouth wash but she defers.  - amoxicillin-clavulanate (AUGMENTIN) 875-125 MG tablet; Take 1 tablet by mouth 2 (two) times daily.  Dispense: 20 tablet; Refill: 0   Signed, [address] [place], [place], [facility] 11/21/2016 10:22 AM

## 2017-01-17 ENCOUNTER — Ambulatory Visit (INDEPENDENT_AMBULATORY_CARE_PROVIDER_SITE_OTHER): Payer: BLUE CROSS/BLUE SHIELD | Admitting: Family Medicine

## 2017-01-17 ENCOUNTER — Encounter: Payer: Self-pay | Admitting: Family Medicine

## 2017-01-17 VITALS — BP 144/100 | HR 114 | Temp 98.7°F | Resp 16 | Ht 62.0 in | Wt 254.0 lb

## 2017-01-17 DIAGNOSIS — J029 Acute pharyngitis, unspecified: Secondary | ICD-10-CM

## 2017-01-17 DIAGNOSIS — G43901 Migraine, unspecified, not intractable, with status migrainosus: Secondary | ICD-10-CM | POA: Diagnosis not present

## 2017-01-17 MED ORDER — KETOROLAC TROMETHAMINE 60 MG/2ML IM SOLN
60.0000 mg | Freq: Once | INTRAMUSCULAR | Status: AC
  Administered 2017-01-17: 60 mg via INTRAMUSCULAR

## 2017-01-17 MED ORDER — CEFDINIR 300 MG PO CAPS: 300.0000 mg | ORAL_CAPSULE | Freq: Two times a day (BID) | ORAL | 0 refills | Status: DC

## 2017-01-17 MED ORDER — PROMETHAZINE HCL 25 MG PO TABS: 25.0000 mg | ORAL_TABLET | Freq: Three times a day (TID) | ORAL | 0 refills | Status: DC | PRN

## 2017-01-17 MED ORDER — METOCLOPRAMIDE HCL 10 MG PO TABS: 10.0000 mg | ORAL_TABLET | Freq: Four times a day (QID) | ORAL | 0 refills | Status: DC | PRN

## 2017-01-17 NOTE — Progress Notes (Signed)
Subjective:    Patient ID: Grace Moore, female    DOB: [birth year], 31 y.o.   MRN: [id number]  HPI Patient has had a severe migraine headache for more than 72 hours it is located in her occiput.  It is pulsatile with nausea and photophobia.  Excedrin Migraine which normally works for the patient is not helping his headache.  However 24 hours ago, she also developed tonsillar swelling and a severe sore throat.  She denies any fever or otalgia or rhinorrhea or cough.  She does have erythema in the posterior oropharynx along with some mild tonsillar swelling and lymphadenopathy in the neck Past Medical History:  Diagnosis Date  . GERD (gastroesophageal reflux disease)   . Headache    no med, lay down, sleep  . History of kidney stones    passed stone, no surgery   Past Surgical History:  Procedure Laterality Date  . CESAREAN SECTION  11/2009   [place]  . CESAREAN SECTION WITH BILATERAL TUBAL LIGATION Bilateral 08/27/2014   Procedure: REPEAT CESAREAN SECTION WITH BILATERAL TUBAL LIGATION ;  Surgeon: Aloha Gell, MD;  Location: [facility];  Service: Obstetrics;  Laterality: Bilateral;  EDD: 09/03/14  . CHOLECYSTECTOMY    . WISDOM TOOTH EXTRACTION    . WRIST SURGERY     x 2 one left and 1 right    Current Outpatient Medications on File Prior to Visit  Medication Sig Dispense Refill  . pantoprazole (PROTONIX) 40 MG tablet Take 1 tablet (40 mg total) by mouth 2 (two) times daily. 180 tablet 3   No current facility-administered medications on file prior to visit.    No Known Allergies Social History   Socioeconomic History  . Marital status: Married    Spouse name: Not on file  . Number of children: Not on file  . Years of education: Not on file  . Highest education level: Not on file  Social Needs  . Financial resource strain: Not on file  . Food insecurity - worry: Not on file  . Food insecurity - inability: Not on file  . Transportation needs - medical: Not on file  .  Transportation needs - non-medical: Not on file  Occupational History  . Not on file  Tobacco Use  . Smoking status: Never Smoker  . Smokeless tobacco: Never Used  Substance and Sexual Activity  . Alcohol use: No  . Drug use: No  . Sexual activity: Yes    Birth control/protection: None    Comment: pregnant  Other Topics Concern  . Not on file  Social History Narrative  . Not on file       Review of Systems  All other systems reviewed and are negative.      Objective:   Physical Exam  Constitutional: She is oriented to person, place, and time.  HENT:  Right Ear: Tympanic membrane and ear canal normal.  Left Ear: Tympanic membrane and ear canal normal.  Nose: Nose normal.  Mouth/Throat: Posterior oropharyngeal edema and posterior oropharyngeal erythema present. No oropharyngeal exudate.  Eyes: Conjunctivae and EOM are normal. Pupils are equal, round, and reactive to light.  Cardiovascular: Normal rate, regular rhythm and normal heart sounds.  Pulmonary/Chest: Effort normal and breath sounds normal. No respiratory distress. She has no wheezes. She has no rales.  Abdominal: Soft. Bowel sounds are normal. She exhibits no distension. There is no tenderness. There is no rebound and no guarding.  Lymphadenopathy:    She has cervical adenopathy.  Neurological: She is alert and oriented to person, place, and time. She has normal reflexes. She displays normal reflexes. No cranial nerve deficit. She exhibits normal muscle tone. Coordination normal.  Vitals reviewed.         Assessment & Plan:  Sore throat - Plan: STREP GROUP A AG, W/REFLEX TO CULT, cefdinir (OMNICEF) 300 MG capsule, metoCLOPramide (REGLAN) 10 MG tablet, promethazine (PHENERGAN) 25 MG tablet  Status migrainosus  I will give the patient 60 mg of Toradol IM x1 and then start her on Phenergan 25 mg every 8 hours and Reglan 10 mg every 6 hours as needed for migraine headache.  Also take ibuprofen 800 mg every 8  hours at home.  I will treat the pharyngitis given the severity of its appearance with Omnicef 300 mg p.o. twice daily for 10 days and recheck next week or sooner if worse

## 2017-01-17 NOTE — Addendum Note (Signed)
Addended by: Shary Decamp B on: 01/17/2017 10:44 AM   Modules accepted: Orders

## 2017-01-19 LAB — CULTURE, GROUP A STREP
MICRO NUMBER:: 81295255
SPECIMEN QUALITY: ADEQUATE

## 2017-01-19 LAB — STREP GROUP A AG, W/REFLEX TO CULT: STREPTOCOCCUS, GROUP A SCREEN (DIRECT): NOT DETECTED

## 2017-01-27 ENCOUNTER — Encounter: Payer: Self-pay | Admitting: Family Medicine

## 2017-01-27 MED ORDER — BUTALBITAL-APAP-CAFFEINE 50-325-40 MG PO TABS: 1.0000 | ORAL_TABLET | Freq: Four times a day (QID) | ORAL | 0 refills | Status: DC | PRN

## 2017-01-30 ENCOUNTER — Encounter (HOSPITAL_BASED_OUTPATIENT_CLINIC_OR_DEPARTMENT_OTHER): Payer: Self-pay | Admitting: *Deleted

## 2017-01-30 ENCOUNTER — Emergency Department (HOSPITAL_BASED_OUTPATIENT_CLINIC_OR_DEPARTMENT_OTHER)
Admission: EM | Admit: 2017-01-30 | Discharge: 2017-01-30 | Disposition: A | Discharge status: Home / Self Care | Payer: BLUE CROSS/BLUE SHIELD | Attending: Emergency Medicine | Admitting: Emergency Medicine

## 2017-01-30 ENCOUNTER — Other Ambulatory Visit: Payer: Self-pay

## 2017-01-30 DIAGNOSIS — R51 Headache: Secondary | ICD-10-CM | POA: Insufficient documentation

## 2017-01-30 DIAGNOSIS — R519 Headache, unspecified: Secondary | ICD-10-CM

## 2017-01-30 DIAGNOSIS — Z79899 Other long term (current) drug therapy: Secondary | ICD-10-CM | POA: Insufficient documentation

## 2017-01-30 MED ORDER — KETOROLAC TROMETHAMINE 30 MG/ML IJ SOLN
30.0000 mg | Freq: Once | INTRAMUSCULAR | Status: AC
Start: 2017-01-30 — End: 2017-01-30
  Administered 2017-01-30: 30 mg via INTRAVENOUS
  Filled 2017-01-30: qty 1

## 2017-01-30 MED ORDER — DEXAMETHASONE SODIUM PHOSPHATE 10 MG/ML IJ SOLN
10.0000 mg | Freq: Once | INTRAMUSCULAR | Status: AC
Start: 2017-01-30 — End: 2017-01-30
  Administered 2017-01-30: 10 mg via INTRAVENOUS
  Filled 2017-01-30: qty 1

## 2017-01-30 MED ORDER — SODIUM CHLORIDE 0.9 % IV BOLUS (SEPSIS)
1000.0000 mL | Freq: Once | INTRAVENOUS | Status: AC
  Administered 2017-01-30: 1000 mL via INTRAVENOUS

## 2017-01-30 MED ORDER — DIPHENHYDRAMINE HCL 50 MG/ML IJ SOLN
25.0000 mg | Freq: Once | INTRAMUSCULAR | Status: AC
  Administered 2017-01-30: 25 mg via INTRAVENOUS
  Filled 2017-01-30: qty 1

## 2017-01-30 MED ORDER — PROCHLORPERAZINE EDISYLATE 5 MG/ML IJ SOLN
10.0000 mg | Freq: Once | INTRAMUSCULAR | Status: AC
  Administered 2017-01-30: 10 mg via INTRAVENOUS
  Filled 2017-01-30: qty 2

## 2017-01-30 NOTE — ED Provider Notes (Signed)
[facility] EMERGENCY DEPARTMENT Provider Note   CSN: [id number] Arrival date & time: 01/30/17  1126     History   Chief Complaint Chief Complaint  Patient presents with  . Headache    HPI Grace Moore is a 31 y.o. female.  HPI Grace Moore is a 31 y.o. female with history of headaches, presents to emergency department complaining of persistent headache.  Patient states she has had headaches since delivery of her first child 7 years ago.  She states headaches are every 1-2 months.  She has never seen a specialist.  She is usually takes Excedrin Migraine which normally helps.  She states this headache started about 2 weeks ago.  At that time states also had some sore throat.  She was treated with Omnicef, Toradol IM, and Reglan and Phenergan at home.  She states that her tonsils went down, but states that she did not take Reglan and Phenergan because it would "just put me to sleep."  She states Toradol did not help her headache.  She spoke with her doctor again, and was given prescription for Fioricet which she has been taking but it is not helping.  She is here because her symptoms are not improving.  She reports photosensitivity.  She reports nausea, no vomiting.  She says she is not sleeping a lot but that is normal for her.  Denies any numbness or weakness in extremities.  No visual changes.  She does wear contacts and has seen ophthalmologist for exam 3 months ago.  She denies any difficulty speaking.  No memory problems.  No difficulty ambulating.  No other associated symptoms.  Past Medical History:  Diagnosis Date  . GERD (gastroesophageal reflux disease)   . Headache    no med, lay down, sleep  . History of kidney stones    passed stone, no surgery    Patient Active Problem List   Diagnosis Date Noted  . Iron deficiency anemia of pregnancy 08/28/2014  . Blood loss anemia 08/28/2014  . Rh negative status during pregnancy 08/28/2014  . Postpartum care  following cesarean delivery (6/25) 08/27/2014  . Status post repeat low transverse cesarean section & BTL 08/27/2014    Past Surgical History:  Procedure Laterality Date  . CESAREAN SECTION  11/2009   [place]  . CESAREAN SECTION WITH BILATERAL TUBAL LIGATION Bilateral 08/27/2014   Procedure: REPEAT CESAREAN SECTION WITH BILATERAL TUBAL LIGATION ;  Surgeon: Aloha Gell, MD;  Location: [facility];  Service: Obstetrics;  Laterality: Bilateral;  EDD: 09/03/14  . CHOLECYSTECTOMY    . WISDOM TOOTH EXTRACTION    . WRIST SURGERY     x 2 one left and 1 right     OB History    Gravida Para Term Preterm AB Living   2 2 2     2    SAB TAB Ectopic Multiple Live Births         0 2       Home Medications    Prior to Admission medications   Medication Sig Start Date End Date Taking? Authorizing Provider  butalbital-acetaminophen-caffeine (FIORICET, ESGIC) 50-325-40 MG tablet Take 1 tablet by mouth every 6 (six) hours as needed for headache. 01/27/17 01/27/18  Alycia Rossetti, MD  cefdinir (OMNICEF) 300 MG capsule Take 1 capsule (300 mg total) 2 (two) times daily by mouth. 01/17/17   Susy Frizzle, MD  metoCLOPramide (REGLAN) 10 MG tablet Take 1 tablet (10 mg total) every 6 (six) hours as  needed by mouth for nausea (headache). 01/17/17   Susy Frizzle, MD  pantoprazole (PROTONIX) 40 MG tablet Take 1 tablet (40 mg total) by mouth 2 (two) times daily. 05/03/16   Susy Frizzle, MD  promethazine (PHENERGAN) 25 MG tablet Take 1 tablet (25 mg total) every 8 (eight) hours as needed by mouth for nausea or vomiting. 01/17/17   Susy Frizzle, MD    Family History No family history on file.  Social History Social History   Tobacco Use  . Smoking status: Never Smoker  . Smokeless tobacco: Never Used  Substance Use Topics  . Alcohol use: No  . Drug use: No     Allergies   Patient has no known allergies.   Review of Systems Review of Systems  Constitutional: Negative for chills and  fever.  HENT: Negative for congestion and sore throat.   Eyes: Positive for photophobia. Negative for pain and visual disturbance.  Respiratory: Negative for cough, chest tightness and shortness of breath.   Cardiovascular: Negative for chest pain, palpitations and leg swelling.  Gastrointestinal: Negative for abdominal pain, diarrhea, nausea and vomiting.  Genitourinary: Negative for dysuria, flank pain, pelvic pain, vaginal bleeding, vaginal discharge and vaginal pain.  Musculoskeletal: Negative for arthralgias, myalgias, neck pain and neck stiffness.  Skin: Negative for rash.  Neurological: Positive for dizziness. Negative for weakness, numbness and headaches.  All other systems reviewed and are negative.    Physical Exam Updated Vital Signs BP 132/78   Pulse (!) 102   Temp 98.5 F (36.9 C) (Oral)   Resp 16   Ht 5\' 2"  (1.575 m)   Wt 115.2 kg (254 lb)   LMP 01/27/2017   SpO2 100%   BMI 46.46 kg/m   Physical Exam  Constitutional: She is oriented to person, place, and time. She appears well-developed and well-nourished. No distress.  HENT:  Head: Normocephalic.  Eyes: Conjunctivae and EOM are normal. Pupils are equal, round, and reactive to light.  Neck: Neck supple.  Cardiovascular: Normal rate, regular rhythm and normal heart sounds.  Pulmonary/Chest: Effort normal and breath sounds normal. No respiratory distress. She has no wheezes. She has no rales.  Abdominal: Soft. Bowel sounds are normal. She exhibits no distension. There is no tenderness. There is no rebound.  Musculoskeletal: She exhibits no edema.  Neurological: She is alert and oriented to person, place, and time.  5/5 and equal upper and lower extremity strength bilaterally. Equal grip strength bilaterally. Normal finger to nose and heel to shin. No pronator drift.   Skin: Skin is warm and dry.  Psychiatric: She has a normal mood and affect. Her behavior is normal.  Nursing note and vitals reviewed.    ED  Treatments / Results  Labs (all labs ordered are listed, but only abnormal results are displayed) Labs Reviewed - No data to display  EKG  EKG Interpretation None       Radiology No results found.  Procedures Procedures (including critical care time)  Medications Ordered in ED Medications  sodium chloride 0.9 % bolus 1,000 mL (not administered)  ketorolac (TORADOL) 30 MG/ML injection 30 mg (not administered)  dexamethasone (DECADRON) injection 10 mg (not administered)  prochlorperazine (COMPAZINE) injection 10 mg (not administered)  diphenhydrAMINE (BENADRYL) injection 25 mg (not administered)     Initial Impression / Assessment and Plan / ED Course  I have reviewed the triage vital signs and the nursing notes.  Pertinent labs & imaging results that were available during my care  of the patient were reviewed by me and considered in my medical decision making (see chart for details).     Pt with persistent headache. Hx of headaches for 7 years but never seen a specialist or had any imaging of the head done.  Today's history examination does not warrant any emergent imaging.  She has normal neurological exam.  She is not vomiting.  She does appear to be dry, will give her IV fluids and IV medications for her headache.  I will have her follow-up with her primary care doctor and a headache on the center.  Pt feels much better with medications and fluids. Headache resolved. Stable for dc home. Return precautions discussed.   Vitals:   01/30/17 1132 01/30/17 1134 01/30/17 1355  BP:  132/78 (!) 158/63  Pulse:  (!) 102 89  Resp:  16 18  Temp:  98.5 F (36.9 C)   TempSrc:  Oral   SpO2:  100% 100%  Weight: 115.2 kg (254 lb)    Height: 5\' 2"  (1.575 m)       Final Clinical Impressions(s) / ED Diagnoses   Final diagnoses:  Bad headache    ED Discharge Orders    None       Jeannett Senior, PA-C 01/30/17 [place], [place], DO 02/01/17 1648

## 2017-01-30 NOTE — Discharge Instructions (Signed)
Please follow up with family doctor if headaches do not improve. Drink plenty of fluids. Get good rest. Return if worsening symptoms.

## 2017-01-30 NOTE — Telephone Encounter (Signed)
I just now had a chance to review this message. I then went to review her chart to make treatment plan. See that she is now at the ER.

## 2017-01-30 NOTE — ED Triage Notes (Signed)
Headache x 2 weeks. She was seen at [facility] x 2 since the headache.

## 2017-02-03 NOTE — Telephone Encounter (Signed)
She has been treated at ED

## 2017-05-22 ENCOUNTER — Ambulatory Visit (INDEPENDENT_AMBULATORY_CARE_PROVIDER_SITE_OTHER): Payer: BLUE CROSS/BLUE SHIELD | Admitting: Family Medicine

## 2017-05-22 ENCOUNTER — Other Ambulatory Visit: Payer: Self-pay

## 2017-05-22 ENCOUNTER — Encounter: Payer: Self-pay | Admitting: Family Medicine

## 2017-05-22 VITALS — BP 122/84 | HR 104 | Temp 98.5°F | Resp 17 | Ht 62.5 in | Wt 252.4 lb

## 2017-05-22 DIAGNOSIS — K219 Gastro-esophageal reflux disease without esophagitis: Secondary | ICD-10-CM | POA: Diagnosis not present

## 2017-05-22 DIAGNOSIS — G43909 Migraine, unspecified, not intractable, without status migrainosus: Secondary | ICD-10-CM | POA: Insufficient documentation

## 2017-05-22 DIAGNOSIS — G43009 Migraine without aura, not intractable, without status migrainosus: Secondary | ICD-10-CM

## 2017-05-22 DIAGNOSIS — J069 Acute upper respiratory infection, unspecified: Secondary | ICD-10-CM | POA: Diagnosis not present

## 2017-05-22 MED ORDER — BENZONATATE 100 MG PO CAPS: 100.0000 mg | ORAL_CAPSULE | Freq: Two times a day (BID) | ORAL | 0 refills | Status: DC | PRN

## 2017-05-22 MED ORDER — GUAIFENESIN-CODEINE 100-10 MG/5ML PO SOLN: 5.0000 mL | Freq: Four times a day (QID) | ORAL | 0 refills | Status: DC | PRN

## 2017-05-22 NOTE — Progress Notes (Signed)
Subjective  CC:  Chief Complaint  Patient presents with  . URI    started last Friday, cough, congested when lying down  . Establish Care    HPI: Grace Moore is a 32 y.o. female who presents to [place] at [facility] today to establish care with me as a new patient.   She has the following concerns or needs:  Acute onset cough, dry and spasm like. No fever, st, chest pain or sob. No h/o asthma or wheezing. Has had this before. Onset was 4 days ago. Denies st or body aches. Hasn't used any meds. Worse with pnd while lying down.   Former pt of Dr. Dennard Schaumann. I reviewed notes. Has GERD on PPI and had uncontrolled migraines now doing better since seeing the headache specialist.   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 No h/o mood disorders. She is overweight.   Sees GYN and had well female visit 2 weeks ago with nl pap.   We updated and reviewed the patient's past history in detail and it is documented below.  Patient Active Problem List   Diagnosis Date Noted  . GERD (gastroesophageal reflux disease) 05/22/2017  . Migraine headache 05/22/2017  . Morbid obesity ([place]) 05/22/2017  . Status post repeat low transverse cesarean section & BTL 08/27/2014   Health Maintenance  Topic Date Due  . PAP SMEAR  05/08/2020  . TETANUS/TDAP  06/07/2024  . INFLUENZA VACCINE  Completed  . HIV Screening  Completed   Immunization History  Administered Date(s) Administered  . DTaP 07/18/1987, 09/18/1987, 11/21/1987, 12/19/1988, 09/27/1992  . HPV Quadrivalent 03/04/2011, 05/02/2011, 09/02/2011  . Hepatitis B 12/18/1998, 02/01/1999, 06/25/1999  . IPV 07/18/1987, 09/18/1987, 12/19/1988, 09/27/1992  . Influenza,inj,Quad PF,6+ Mos 11/06/2012, 12/10/2013, 11/09/2015, 03/03/2017  . Influenza-Unspecified 03/10/2017  . MMR 09/05/1988, 09/27/1988  . Td 08/19/2005, 08/19/2005  . Tdap 06/08/2014   Current Meds  Medication Sig  . pantoprazole  (PROTONIX) 40 MG tablet Take 1 tablet (40 mg total) by mouth 2 (two) times daily.  Marland Kitchen zonisamide (ZONEGRAN) 100 MG capsule Take 1 capsule by mouth daily.    Allergies: Patient has No Known Allergies. Past Medical History Patient  has a past medical history of GERD (gastroesophageal reflux disease), Headache, and History of kidney stones. Past Surgical History Patient  has a past surgical history that includes Wrist surgery; Cesarean section (11/2009); Cholecystectomy; Wisdom tooth extraction; and Cesarean section with bilateral tubal ligation (Bilateral, 08/27/2014). Family History: Patient family history includes AAA (abdominal aortic aneurysm) in her father; Arthritis in her maternal grandmother; Asthma in her son; Breast cancer in her maternal grandmother; Diabetes in her maternal grandfather; Healthy in her daughter; Heart attack in her father; Hyperlipidemia in her father; Hypertension in her father and mother; Kidney disease in her maternal grandmother; Stroke in her father. Social History:  Patient  reports that she has never smoked. She has never used smokeless tobacco. She reports that she does not drink alcohol or use drugs.  Review of Systems: Constitutional: negative for fever or malaise Ophthalmic: negative for photophobia, double vision or loss of vision Cardiovascular: negative for chest pain, dyspnea on exertion, or new LE swelling Respiratory: negative for SOB or productive cough Gastrointestinal: negative for abdominal pain, change in bowel habits or melena Genitourinary: negative for dysuria or gross hematuria Musculoskeletal: negative for new gait disturbance or muscular weakness Integumentary: negative for new or persistent rashes Neurological: negative for TIA or stroke symptoms Psychiatric: negative for  SI or delusions Allergic/Immunologic: negative for hives  Patient Care Team    Relationship Specialty Notifications Start End  Leamon Arnt, MD PCP - General  Family Medicine  05/22/17   Aloha Gell, MD Consulting Physician Obstetrics and Gynecology  05/22/17   Orie Rout, MD Consulting Physician Neurology  05/22/17   Posey Pronto, [place] Physician Dentistry  05/22/17   [facility]    05/22/17     Objective  Vitals: BP 122/84   Pulse (!) 104   Temp 98.5 F (36.9 C) (Oral)   Resp 17   Ht 5' 2.5" (1.588 m)   Wt 252 lb 6.4 oz (114.5 kg)   LMP 04/05/2017   SpO2 98%   BMI 45.43 kg/m  General:  Well developed, well nourished, no acute distress but does cough Psych:  Alert and oriented,normal mood and affect HEENT:  Normocephalic, atraumatic, non-icteric sclera, PERRL, oropharynx is without mass or exudate, supple neck without adenopathy, mass or thyromegaly Cardiovascular:  RRR without gallop, rub or murmur, nondisplaced PMI Respiratory:  Good breath sounds bilaterally, CTAB with normal respiratory effort, able to take deep breaths Gastrointestinal: normal bowel sounds, soft, non-tender, no noted masses. No HSM MSK: no deformities, contusions. Joints are without erythema or swelling Skin:  Warm, no rashes or suspicious lesions noted Neurologic:    Mental status is normal. Gross motor and sensory exams are normal. Normal gait  Assessment  1. URI with cough and congestion   2. Gastroesophageal reflux disease without esophagitis   3. Migraine without aura and without status migrainosus, not intractable   4. Morbid obesity ([place])      Plan   URI with cough: supportive care. Likely viral. F/u if does not resolve.   GERD is controlled. Needs weight loss.  Migraines improving per neuro.  Follow up:  Return in about 6 months (around 11/22/2017) for complete physical.  Commons side effects, risks, benefits, and alternatives for medications and treatment plan prescribed today were discussed, and the patient expressed understanding of the given instructions. Patient is instructed to call or message via MyChart if he/she has any  questions or concerns regarding our treatment plan. No barriers to understanding were identified. We discussed Red Flag symptoms and signs in detail. Patient expressed understanding regarding what to do in case of urgent or emergency type symptoms.   Medication list was reconciled, printed and provided to the patient in AVS. Patient instructions and summary information was reviewed with the patient as documented in the AVS. This note was prepared with assistance of Dragon voice recognition software. Occasional wrong-word or sound-a-like substitutions may have occurred due to the inherent limitations of voice recognition software  No orders of the defined types were placed in this encounter.  Meds ordered this encounter  Medications  . benzonatate (TESSALON) 100 MG capsule    Sig: Take 1 capsule (100 mg total) by mouth 2 (two) times daily as needed for cough.    Dispense:  20 capsule    Refill:  0  . guaiFENesin-codeine 100-10 MG/5ML syrup    Sig: Take 5 mLs by mouth every 6 (six) hours as needed for cough.    Dispense:  120 mL    Refill:  0

## 2017-05-22 NOTE — Patient Instructions (Signed)
Please return in 6 months for your annual complete physical; please come fasting.   It was a pleasure meeting you today! Thank you for choosing [place] to meet your healthcare needs! I truly look forward to working with you. If you have any questions or concerns, please send me a message via Mychart or call the office at [phone].   Upper Respiratory Infection, Adult Most upper respiratory infections (URIs) are a viral infection of the air passages leading to the lungs. A URI affects the nose, throat, and upper air passages. The most common type of URI is nasopharyngitis and is typically referred to as "the common cold." URIs run their course and usually go away on their own. Most of the time, a URI does not require medical attention, but sometimes a bacterial infection in the upper airways can follow a viral infection. This is called a secondary infection. Sinus and middle ear infections are common types of secondary upper respiratory infections. Bacterial pneumonia can also complicate a URI. A URI can worsen asthma and chronic obstructive pulmonary disease (COPD). Sometimes, these complications can require emergency medical care and may be life threatening. What are the causes? Almost all URIs are caused by viruses. A virus is a type of germ and can spread from one person to another. What increases the risk? You may be at risk for a URI if:  You smoke.  You have chronic heart or lung disease.  You have a weakened defense (immune) system.  You are very young or very old.  You have nasal allergies or asthma.  You work in crowded or poorly ventilated areas.  You work in health care facilities or schools.  What are the signs or symptoms? Symptoms typically develop 2-3 days after you come in contact with a cold virus. Most viral URIs last 7-10 days. However, viral URIs from the influenza virus (flu virus) can last 14-18 days and are typically more severe. Symptoms may include:  Runny or  stuffy (congested) nose.  Sneezing.  Cough.  Sore throat.  Headache.  Fatigue.  Fever.  Loss of appetite.  Pain in your forehead, behind your eyes, and over your cheekbones (sinus pain).  Muscle aches.  How is this diagnosed? Your health care provider may diagnose a URI by:  Physical exam.  Tests to check that your symptoms are not due to another condition such as: ? Strep throat. ? Sinusitis. ? Pneumonia. ? Asthma.  How is this treated? A URI goes away on its own with time. It cannot be cured with medicines, but medicines may be prescribed or recommended to relieve symptoms. Medicines may help:  Reduce your fever.  Reduce your cough.  Relieve nasal congestion.  Follow these instructions at home:  Take medicines only as directed by your health care provider.  Gargle warm saltwater or take cough drops to comfort your throat as directed by your health care provider.  Use a warm mist humidifier or inhale steam from a shower to increase air moisture. This may make it easier to breathe.  Drink enough fluid to keep your urine clear or pale yellow.  Eat soups and other clear broths and maintain good nutrition.  Rest as needed.  Return to work when your temperature has returned to normal or as your health care provider advises. You may need to stay home longer to avoid infecting others. You can also use a face mask and careful hand washing to prevent spread of the virus.  Increase the usage of your  inhaler if you have asthma.  Do not use any tobacco products, including cigarettes, chewing tobacco, or electronic cigarettes. If you need help quitting, ask your health care provider. How is this prevented? The best way to protect yourself from getting a cold is to practice good hygiene.  Avoid oral or hand contact with people with cold symptoms.  Wash your hands often if contact occurs.  There is no clear evidence that vitamin C, vitamin E, echinacea, or exercise  reduces the chance of developing a cold. However, it is always recommended to get plenty of rest, exercise, and practice good nutrition. Contact a health care provider if:  You are getting worse rather than better.  Your symptoms are not controlled by medicine.  You have chills.  You have worsening shortness of breath.  You have brown or red mucus.  You have yellow or brown nasal discharge.  You have pain in your face, especially when you bend forward.  You have a fever.  You have swollen neck glands.  You have pain while swallowing.  You have white areas in the back of your throat. Get help right away if:  You have severe or persistent: ? Headache. ? Ear pain. ? Sinus pain. ? Chest pain.  You have chronic lung disease and any of the following: ? Wheezing. ? Prolonged cough. ? Coughing up blood. ? A change in your usual mucus.  You have a stiff neck.  You have changes in your: ? Vision. ? Hearing. ? Thinking. ? Mood. This information is not intended to replace advice given to you by your health care provider. Make sure you discuss any questions you have with your health care provider. Document Released: 08/14/2000 Document Revised: 10/22/2015 Document Reviewed: 05/26/2013 Elsevier Interactive Patient Education  [employer].

## 2017-06-03 ENCOUNTER — Ambulatory Visit: Payer: BLUE CROSS/BLUE SHIELD | Admitting: Family Medicine

## 2017-06-11 ENCOUNTER — Encounter: Payer: Self-pay | Admitting: Family Medicine

## 2017-06-12 ENCOUNTER — Other Ambulatory Visit: Payer: Self-pay | Admitting: Emergency Medicine

## 2017-06-12 MED ORDER — PANTOPRAZOLE SODIUM 40 MG PO TBEC: 40.0000 mg | DELAYED_RELEASE_TABLET | Freq: Two times a day (BID) | ORAL | 0 refills | Status: DC

## 2017-06-12 MED ORDER — PANTOPRAZOLE SODIUM 40 MG PO TBEC: 40.0000 mg | DELAYED_RELEASE_TABLET | Freq: Two times a day (BID) | ORAL | 3 refills | Status: DC

## 2017-07-11 ENCOUNTER — Encounter: Payer: BLUE CROSS/BLUE SHIELD | Admitting: Family Medicine

## 2017-07-14 ENCOUNTER — Encounter: Payer: BLUE CROSS/BLUE SHIELD | Admitting: Family Medicine

## 2017-10-13 ENCOUNTER — Encounter: Payer: Self-pay | Admitting: Family Medicine

## 2017-10-13 ENCOUNTER — Ambulatory Visit (INDEPENDENT_AMBULATORY_CARE_PROVIDER_SITE_OTHER): Payer: BLUE CROSS/BLUE SHIELD | Admitting: Family Medicine

## 2017-10-13 ENCOUNTER — Other Ambulatory Visit: Payer: Self-pay

## 2017-10-13 VITALS — BP 132/90 | HR 82 | Temp 98.3°F | Ht 62.5 in | Wt 247.8 lb

## 2017-10-13 DIAGNOSIS — M546 Pain in thoracic spine: Secondary | ICD-10-CM | POA: Diagnosis not present

## 2017-10-13 MED ORDER — DICLOFENAC SODIUM 75 MG PO TBEC: 75.0000 mg | DELAYED_RELEASE_TABLET | Freq: Two times a day (BID) | ORAL | 0 refills | Status: DC

## 2017-10-13 NOTE — Patient Instructions (Addendum)
Return for your annual complete physical; please come fasting at your convenience.    Thoracic Strain A thoracic strain, which is sometimes called a mid-back strain, is an injury to the muscles or tendons that attach to the upper part of your back behind your chest. This type of injury occurs when a muscle is overstretched or overloaded. Thoracic strains can range from mild to severe. Mild strains may involve stretching a muscle or tendon without tearing it. These injuries may heal in 1-2 weeks. More severe strains involve tearing of muscle fibers or tendons. These will cause more pain and may take 6-8 weeks to heal. What are the causes? This condition may be caused by:  An injury in which a sudden force is placed on the muscle.  Exercising without properly warming up.  Overuse of the muscle.  Improper form during certain movements.  Other injuries that surround or cause stress on the mid-back, causing a strain on the muscles.  In some cases, the cause may not be known. What increases the risk? This injury is more common in:  Athletes.  People with obesity.  What are the signs or symptoms? The main symptom of this condition is pain, especially with movement. Other symptoms include:  Bruising.  Swelling.  Spasm.  How is this diagnosed? This condition may be diagnosed with a physical exam. X-rays may be taken to check for a fracture. How is this treated? This condition may be treated with:  Resting and icing the injured area.  Physical therapy. This will involve doing stretching and strengthening exercises.  Medicines for pain and inflammation.  Follow these instructions at home:  Rest as needed. Follow instructions from your health care provider about any restrictions on activity.  If directed, apply ice to the injured area: ? Put ice in a plastic bag. ? Place a towel between your skin and the bag. ? Leave the ice on for 20 minutes, 2-3 times per day.  Take  over-the-counter and prescription medicines only as told by your health care provider.  Begin doing exercises as told by your health care provider or physical therapist.  Always warm up properly before physical activity or sports.  Bend your knees before you lift heavy objects.  Keep all follow-up visits as told by your health care provider. This is important. Contact a health care provider if:  Your pain is not helped by medicine.  Your pain, bruising, or swelling is getting worse.  You have a fever. Get help right away if:  You have shortness of breath.  You have chest pain.  You develop numbness or weakness in your legs.  You have involuntary loss of urine (urinary incontinence). This information is not intended to replace advice given to you by your health care provider. Make sure you discuss any questions you have with your health care provider. Document Released: 05/11/2003 Document Revised: 10/21/2015 Document Reviewed: 04/14/2014 Elsevier Interactive Patient Education  [employer].

## 2017-10-13 NOTE — Progress Notes (Signed)
Subjective  CC:  Chief Complaint  Patient presents with  . Back Pain    right shoulder injury in May, got better and now starting hurting again on Friday. 7/10 on pain scale    HPI: Grace Moore is a 32 y.o. female who presents to the office today to address the problems listed above in the chief complaint.  Has pain medial to left scapula; started several days ago after having to sit in metal, non-supportive chairs 8 hrs/day x  3 days. Denies neck or shoulder pain. No radicular sxs. No radiation. Hasn't taken any meds. Interfering with sleep due to pain. Had similar pain in may after putting out mulch in her yard. That resolved after about 2 weeks;no tx or meds required.    Assessment  1. Acute right-sided thoracic back pain      Plan   Thoracic strain/pain:  Start nsaids/heat/massage. See AVS.   Follow up: f/u if persists. cpe at pts convenience.   No orders of the defined types were placed in this encounter.  Meds ordered this encounter  Medications  . diclofenac (VOLTAREN) 75 MG EC tablet    Sig: Take 1 tablet (75 mg total) by mouth 2 (two) times daily.    Dispense:  30 tablet    Refill:  0      I reviewed the patients updated PMH, FH, and SocHx.    Patient Active Problem List   Diagnosis Date Noted  . GERD (gastroesophageal reflux disease) 05/22/2017  . Migraine headache 05/22/2017  . Morbid obesity ([place]) 05/22/2017  . Status post repeat low transverse cesarean section & BTL 08/27/2014   Current Meds  Medication Sig  . pantoprazole (PROTONIX) 40 MG tablet Take 1 tablet (40 mg total) by mouth 2 (two) times daily.  . [DISCONTINUED] zonisamide (ZONEGRAN) 100 MG capsule Take 1 capsule by mouth daily.    Allergies: Patient has No Known Allergies. Family History: Patient family history includes AAA (abdominal aortic aneurysm) in her father; Arthritis in her maternal grandmother; Asthma in her son; Breast cancer in her maternal grandmother; Diabetes in her  maternal grandfather; Healthy in her daughter; Heart attack in her father; Hyperlipidemia in her father; Hypertension in her father and mother; Kidney disease in her maternal grandmother; Stroke in her father. Social History:  Patient  reports that she has never smoked. She has never used smokeless tobacco. She reports that she does not drink alcohol or use drugs.  Review of Systems: Constitutional: Negative for fever malaise or anorexia Cardiovascular: negative for chest pain Respiratory: negative for SOB or persistent cough Gastrointestinal: negative for abdominal pain  Objective  Vitals: BP 132/90   Pulse 82   Temp 98.3 F (36.8 C)   Ht 5' 2.5" (1.588 m)   Wt 247 lb 12.8 oz (112.4 kg)   SpO2 98%   BMI 44.60 kg/m  General: no acute distress , A&Ox3, appears comfortable, moves easily Back: right subscapular mm ttp reproduces pain, nl neck and right shoulder exam. nontender traps. Nl gait   Commons side effects, risks, benefits, and alternatives for medications and treatment plan prescribed today were discussed, and the patient expressed understanding of the given instructions. Patient is instructed to call or message via MyChart if he/she has any questions or concerns regarding our treatment plan. No barriers to understanding were identified. We discussed Red Flag symptoms and signs in detail. Patient expressed understanding regarding what to do in case of urgent or emergency type symptoms.   Medication list  was reconciled, printed and provided to the patient in AVS. Patient instructions and summary information was reviewed with the patient as documented in the AVS. This note was prepared with assistance of Dragon voice recognition software. Occasional wrong-word or sound-a-like substitutions may have occurred due to the inherent limitations of voice recognition software

## 2017-11-23 ENCOUNTER — Encounter: Payer: Self-pay | Admitting: Family Medicine

## 2017-11-25 ENCOUNTER — Encounter: Payer: BLUE CROSS/BLUE SHIELD | Admitting: Family Medicine

## 2017-12-05 ENCOUNTER — Ambulatory Visit: Payer: BLUE CROSS/BLUE SHIELD

## 2017-12-12 ENCOUNTER — Encounter: Payer: Self-pay | Admitting: Family Medicine

## 2017-12-12 ENCOUNTER — Ambulatory Visit (INDEPENDENT_AMBULATORY_CARE_PROVIDER_SITE_OTHER): Payer: BLUE CROSS/BLUE SHIELD | Admitting: Family Medicine

## 2017-12-12 ENCOUNTER — Other Ambulatory Visit: Payer: Self-pay

## 2017-12-12 VITALS — BP 118/82 | HR 72 | Temp 98.0°F | Ht 62.5 in | Wt 244.4 lb

## 2017-12-12 DIAGNOSIS — K219 Gastro-esophageal reflux disease without esophagitis: Secondary | ICD-10-CM

## 2017-12-12 DIAGNOSIS — J029 Acute pharyngitis, unspecified: Secondary | ICD-10-CM

## 2017-12-12 DIAGNOSIS — Z23 Encounter for immunization: Secondary | ICD-10-CM | POA: Diagnosis not present

## 2017-12-12 LAB — COMPREHENSIVE METABOLIC PANEL
ALBUMIN: 4 g/dL (ref 3.5–5.2)
ALT: 14 U/L (ref 0–35)
AST: 12 U/L (ref 0–37)
Alkaline Phosphatase: 68 U/L (ref 39–117)
BUN: 15 mg/dL (ref 6–23)
CALCIUM: 8.9 mg/dL (ref 8.4–10.5)
CHLORIDE: 109 meq/L (ref 96–112)
CO2: 24 mEq/L (ref 19–32)
Creatinine, Ser: 0.81 mg/dL (ref 0.40–1.20)
GFR: 87.9 mL/min (ref >60.00)
Glucose, Bld: 92 mg/dL (ref 70–99)
POTASSIUM: 4.1 meq/L (ref 3.5–5.1)
Sodium: 139 mEq/L (ref 135–145)
Total Bilirubin: 0.5 mg/dL (ref 0.2–1.2)
Total Protein: 6.8 g/dL (ref 6.0–8.3)

## 2017-12-12 LAB — CBC WITH DIFFERENTIAL/PLATELET
Basophils Absolute: 0 10*3/uL (ref 0.0–0.1)
Basophils Relative: 0.5 % (ref 0.0–3.0)
EOS PCT: 1 % (ref 0.0–5.0)
Eosinophils Absolute: 0.1 10*3/uL (ref 0.0–0.7)
HEMATOCRIT: 34.1 % — AB (ref 36.0–46.0)
HEMOGLOBIN: 10.8 g/dL — AB (ref 12.0–15.0)
Lymphocytes Relative: 20.5 % (ref 12.0–46.0)
Lymphs Abs: 1.9 10*3/uL (ref 0.7–4.0)
MCHC: 31.6 g/dL (ref 30.0–36.0)
MCV: 75.7 fl — ABNORMAL LOW (ref 78.0–100.0)
Monocytes Absolute: 0.7 10*3/uL (ref 0.1–1.0)
Monocytes Relative: 7.5 % (ref 3.0–12.0)
Neutro Abs: 6.4 10*3/uL (ref 1.4–7.7)
Neutrophils Relative %: 70.5 % (ref 43.0–77.0)
Platelets: 361 10*3/uL (ref 150.0–400.0)
RBC: 4.5 Mil/uL (ref 3.87–5.11)
RDW: 17.5 % — ABNORMAL HIGH (ref 11.5–15.5)
WBC: 9 10*3/uL (ref 4.0–10.5)

## 2017-12-12 LAB — LIPASE: Lipase: 37 U/L (ref 11.0–59.0)

## 2017-12-12 MED ORDER — ESOMEPRAZOLE MAGNESIUM 40 MG PO CPDR: 40.0000 mg | DELAYED_RELEASE_CAPSULE | Freq: Every day | ORAL | 3 refills | Status: DC

## 2017-12-12 MED ORDER — SUCRALFATE 1 GM/10ML PO SUSP: 1.0000 g | Freq: Three times a day (TID) | ORAL | 0 refills | Status: DC

## 2017-12-12 NOTE — Patient Instructions (Signed)
Please return in 3-4 weeks for recheck.  We will call you with information regarding your referral appointment. Barium swallow to evaluate your esophagus and stomach lining.  If you do not hear from [place] within the next 2 weeks, please let me know. It can take 1-2 weeks to get appointments set up with the specialists.   I will release your lab results to you on your MyChart account with further instructions. Please reply with any questions.   If you have any questions or concerns, please don't hesitate to send me a message via MyChart or call the office at [phone]. Thank you for visiting with [place] today! It's our pleasure caring for you.

## 2017-12-12 NOTE — Progress Notes (Signed)
Subjective  CC:  Chief Complaint  Patient presents with  . Heartburn    on medication has been getting worse over the last couple months     HPI: Grace Moore is a 32 y.o. female who presents to the office today to address the problems listed above in the chief complaint.  Grace Moore presents with worsening GERD symptoms.  Describes daily heartburn that is substernal.  Worse postprandially.  Worse at night.  No weight loss, nausea vomiting Tylenol.  Has had long-standing GERD symptoms, probably 8 years.  Has been on and off PPIs.  Most recently treated with high-dose Protonix for the last 1 to 2 years.  I reviewed notes from her prior PCP.  She was H. pylori breath tested negative last year.  Symptoms have been worsening over the last 2 months.  Unclear triggers.  Tries to avoid fatty foods.  Does not eat spicy foods.  She is morbidly obese and her diet could be improved.  Little exercise.  She denies current stressors.  Has been using Mylanta without relief.  She is status post cholecystectomy.  She denies right or left upper quadrant pain.  Also reports monthly recurrent episodes of sore throat mainly on left side.  Is been ongoing for the last year or so.  Has sore throat today.  Denies allergic rhinitis symptoms.  No PND.  No sneezing or coughing.  No fevers.  Hurts to swallow.  No malaise or fatigue.  She is a day Barista.  She has 2 young children.  They are not sick currently.  Obesity: She reports her diet is fair however she tends to skip meals.  Does not eat too often.  Avoids fast food. Assessment  1. Gastroesophageal reflux disease without esophagitis   2. Sore throat   3. Morbid obesity ([place])      Plan   GERD worsening: Start further evaluation.  Change to Nexium, and Carafate, check lab work, check swallow and follow-up in 3 to 4 weeks.  Sore throat: Question recurrent viral infections.  Strep culture today.  Start logging her symptoms.  Further eval pending  results.  Supportive care for now  Morbid obesity: Started dialogue regarding weight loss.  Weight loss with significantly help her worsening GERD as well.  Follow up: Return in about 4 weeks (around 01/09/2018) for recheck.   Orders Placed This Encounter  Procedures  . Culture, Group A Strep  . DG Esophagus  . Lipase  . Comprehensive metabolic panel  . CBC with Differential/Platelet   Meds ordered this encounter  Medications  . sucralfate (CARAFATE) 1 GM/10ML suspension    Sig: Take 10 mLs (1 g total) by mouth 4 (four) times daily -  with meals and at bedtime.    Dispense:  420 mL    Refill:  0  . esomeprazole (NEXIUM) 40 MG capsule    Sig: Take 1 capsule (40 mg total) by mouth daily.    Dispense:  30 capsule    Refill:  3      I reviewed the patients updated PMH, FH, and SocHx.    Patient Active Problem List   Diagnosis Date Noted  . GERD (gastroesophageal reflux disease) 05/22/2017  . Migraine headache 05/22/2017  . Morbid obesity ([place]) 05/22/2017  . Status post repeat low transverse cesarean section & BTL 08/27/2014   Current Meds  Medication Sig  . zonisamide (ZONEGRAN) 50 MG capsule TK 2 CS PO D  . [DISCONTINUED] pantoprazole (PROTONIX) 40 MG  tablet Take 1 tablet (40 mg total) by mouth 2 (two) times daily.    Allergies: Patient has No Known Allergies. Family History: Patient family history includes AAA (abdominal aortic aneurysm) in her father; Arthritis in her maternal grandmother; Asthma in her son; Breast cancer in her maternal grandmother; Diabetes in her maternal grandfather; Healthy in her daughter; Heart attack in her father; Hyperlipidemia in her father; Hypertension in her father and mother; Kidney disease in her maternal grandmother; Stroke in her father. Social History:  Patient  reports that she has never smoked. She has never used smokeless tobacco. She reports that she does not drink alcohol or use drugs.  Review of Systems: Constitutional:  Negative for fever malaise or anorexia Cardiovascular: negative for chest pain Respiratory: negative for SOB or persistent cough Gastrointestinal: negative for abdominal pain  Objective  Vitals: BP 118/82   Pulse 72   Temp 98 F (36.7 C)   Ht 5' 2.5" (1.588 m)   Wt 244 lb 6.4 oz (110.9 kg)   SpO2 98%   BMI 43.99 kg/m  General: no acute distress , A&Ox3 HEENT: PEERL, conjunctiva normal, Oropharynx moist,neck is supple, posterior pharynx with left erythematous tonsils and tonsilloliths, mildly tender left cervical adenopathy.  Nontender thyroid. Cardiovascular:  RRR without murmur or gallop.  No chest wall tenderness Respiratory:  Good breath sounds bilaterally, CTAB with normal respiratory effort Gastrointestinal: soft, flat abdomen, normal active bowel sounds, no palpable masses, no hepatosplenomegaly, no appreciated hernias Skin:  Warm, no rashes     Commons side effects, risks, benefits, and alternatives for medications and treatment plan prescribed today were discussed, and the patient expressed understanding of the given instructions. Patient is instructed to call or message via MyChart if he/she has any questions or concerns regarding our treatment plan. No barriers to understanding were identified. We discussed Red Flag symptoms and signs in detail. Patient expressed understanding regarding what to do in case of urgent or emergency type symptoms.   Medication list was reconciled, printed and provided to the patient in AVS. Patient instructions and summary information was reviewed with the patient as documented in the AVS. This note was prepared with assistance of Dragon voice recognition software. Occasional wrong-word or sound-a-like substitutions may have occurred due to the inherent limitations of voice recognition software

## 2017-12-14 LAB — CULTURE, GROUP A STREP
MICRO NUMBER: 91225949
SPECIMEN QUALITY:: ADEQUATE

## 2017-12-23 ENCOUNTER — Encounter: Payer: Self-pay | Admitting: Family Medicine

## 2017-12-26 ENCOUNTER — Ambulatory Visit
Admission: RE | Admit: 2017-12-26 | Discharge: 2017-12-26 | Disposition: A | Discharge status: Home / Self Care | Payer: BLUE CROSS/BLUE SHIELD | Source: Ambulatory Visit | Attending: Family Medicine | Admitting: Family Medicine

## 2017-12-26 DIAGNOSIS — K219 Gastro-esophageal reflux disease without esophagitis: Secondary | ICD-10-CM

## 2017-12-31 ENCOUNTER — Encounter: Payer: Self-pay | Admitting: Family Medicine

## 2018-01-01 MED ORDER — ESOMEPRAZOLE MAGNESIUM 40 MG PO CPDR: 40.0000 mg | DELAYED_RELEASE_CAPSULE | Freq: Two times a day (BID) | ORAL | 3 refills | Status: DC

## 2018-01-09 ENCOUNTER — Ambulatory Visit (INDEPENDENT_AMBULATORY_CARE_PROVIDER_SITE_OTHER): Payer: BLUE CROSS/BLUE SHIELD | Admitting: Family Medicine

## 2018-01-09 ENCOUNTER — Encounter: Payer: Self-pay | Admitting: Family Medicine

## 2018-01-09 ENCOUNTER — Other Ambulatory Visit: Payer: Self-pay

## 2018-01-09 VITALS — BP 120/88 | HR 78 | Temp 99.7°F | Ht 62.5 in | Wt 243.0 lb

## 2018-01-09 DIAGNOSIS — K219 Gastro-esophageal reflux disease without esophagitis: Secondary | ICD-10-CM | POA: Diagnosis not present

## 2018-01-09 MED ORDER — ESOMEPRAZOLE MAGNESIUM 40 MG PO CPDR: 40.0000 mg | DELAYED_RELEASE_CAPSULE | Freq: Two times a day (BID) | ORAL | 0 refills | Status: DC

## 2018-01-09 NOTE — Progress Notes (Signed)
Subjective  CC:  Chief Complaint  Patient presents with  . Gastroesophageal Reflux    patient states she is doing better, had her barium swallow test on 10/25    HPI: Grace Moore is a 32 y.o. female who presents to the office today to address the problems listed above in the chief complaint.  Doing well! No longer with GERD or heartburn sxs. Eating well. Reviewed nl Barium swallow. On high dose nexium 40 bid. Never was able to afford the carafate.  Nl labs reviewed  Assessment  1. Gastroesophageal reflux disease without esophagitis      Plan   GERD:  Stable now. Will continue bid nexium x 4 more weeks, then decrease to daily. Will try to get to 20mg  daily ultimately. To discuss at next visit if remains stable. Patient understands and agrees with care plan.    Follow up: Return in about 3 months (around 04/11/2018) for complete physical.   No orders of the defined types were placed in this encounter.  No orders of the defined types were placed in this encounter.     I reviewed the patients updated PMH, FH, and SocHx.    Patient Active Problem List   Diagnosis Date Noted  . GERD (gastroesophageal reflux disease) 05/22/2017  . Migraine headache 05/22/2017  . Morbid obesity ([place]) 05/22/2017  . Status post repeat low transverse cesarean section & BTL 08/27/2014   Current Meds  Medication Sig  . baclofen (LIORESAL) 10 MG tablet TK 1 T PO BID PRN LIMIT 1-2 DAYS PER WEEK  . esomeprazole (NEXIUM) 40 MG capsule Take 1 capsule (40 mg total) by mouth 2 (two) times daily.  Marland Kitchen zonisamide (ZONEGRAN) 50 MG capsule TK 2 CS PO D    Allergies: Patient has No Known Allergies. Family History: Patient family history includes AAA (abdominal aortic aneurysm) in her father; Arthritis in her maternal grandmother; Asthma in her son; Breast cancer in her maternal grandmother; Diabetes in her maternal grandfather; Healthy in her daughter; Heart attack in her father; Hyperlipidemia in her  father; Hypertension in her father and mother; Kidney disease in her maternal grandmother; Stroke in her father. Social History:  Patient  reports that she has never smoked. She has never used smokeless tobacco. She reports that she does not drink alcohol or use drugs.  Review of Systems: Constitutional: Negative for fever malaise or anorexia Cardiovascular: negative for chest pain Respiratory: negative for SOB or persistent cough Gastrointestinal: negative for abdominal pain  Objective  Vitals: Ht 5' 2.5" (1.588 m)   Wt 243 lb (110.2 kg)   BMI 43.74 kg/m  General: no acute distress , A&Ox3 Gastrointestinal: soft, flat abdomen, normal active bowel sounds, no palpable masses, no hepatosplenomegaly, no appreciated hernias Skin:  Warm, no rashes  NL barium swallow test w/o reflux, HH or stricture   Commons side effects, risks, benefits, and alternatives for medications and treatment plan prescribed today were discussed, and the patient expressed understanding of the given instructions. Patient is instructed to call or message via MyChart if he/she has any questions or concerns regarding our treatment plan. No barriers to understanding were identified. We discussed Red Flag symptoms and signs in detail. Patient expressed understanding regarding what to do in case of urgent or emergency type symptoms.   Medication list was reconciled, printed and provided to the patient in AVS. Patient instructions and summary information was reviewed with the patient as documented in the AVS. This note was prepared with assistance of Dragon  voice recognition software. Occasional wrong-word or sound-a-like substitutions may have occurred due to the inherent limitations of voice recognition software

## 2018-01-09 NOTE — Patient Instructions (Addendum)
Please return in 2-3 months for your annual complete physical; please come fasting.  Take the high dose nexium twice a day for 4 more weeks, then decrease to once a day. At your next visit, we will see if we can lower the dose even further.   If you have any questions or concerns, please don't hesitate to send me a message via MyChart or call the office at [phone]. Thank you for visiting with [place] today! It's our pleasure caring for you.

## 2018-03-25 ENCOUNTER — Encounter: Payer: Self-pay | Admitting: Family Medicine

## 2018-04-27 ENCOUNTER — Other Ambulatory Visit: Payer: Self-pay | Admitting: Family Medicine

## 2018-04-27 MED ORDER — ESOMEPRAZOLE MAGNESIUM 40 MG PO CPDR: 40.0000 mg | DELAYED_RELEASE_CAPSULE | Freq: Two times a day (BID) | ORAL | 0 refills | Status: DC

## 2018-05-29 ENCOUNTER — Encounter: Payer: BLUE CROSS/BLUE SHIELD | Admitting: Family Medicine

## 2018-07-06 ENCOUNTER — Encounter: Payer: Self-pay | Admitting: Family Medicine

## 2018-07-06 MED ORDER — ESOMEPRAZOLE MAGNESIUM 40 MG PO CPDR: 40.0000 mg | DELAYED_RELEASE_CAPSULE | Freq: Two times a day (BID) | ORAL | 0 refills | Status: DC

## 2018-07-06 NOTE — Telephone Encounter (Signed)
Please call patient: schedule for cpe on Friday with me: ? In person or virtual or postpone again.  I'm happy with any option that she prefers.   Thank you!

## 2018-07-10 ENCOUNTER — Encounter: Payer: BLUE CROSS/BLUE SHIELD | Admitting: Family Medicine

## 2018-09-30 ENCOUNTER — Encounter: Payer: Self-pay | Admitting: Family Medicine

## 2018-09-30 ENCOUNTER — Ambulatory Visit (INDEPENDENT_AMBULATORY_CARE_PROVIDER_SITE_OTHER): Payer: BC Managed Care – PPO | Admitting: Family Medicine

## 2018-09-30 ENCOUNTER — Other Ambulatory Visit: Payer: Self-pay

## 2018-09-30 VITALS — BP 120/80 | HR 78 | Temp 98.5°F | Resp 16 | Ht 62.5 in | Wt 244.0 lb

## 2018-09-30 DIAGNOSIS — Z0000 Encounter for general adult medical examination without abnormal findings: Secondary | ICD-10-CM

## 2018-09-30 DIAGNOSIS — K219 Gastro-esophageal reflux disease without esophagitis: Secondary | ICD-10-CM | POA: Diagnosis not present

## 2018-09-30 DIAGNOSIS — G43009 Migraine without aura, not intractable, without status migrainosus: Secondary | ICD-10-CM | POA: Diagnosis not present

## 2018-09-30 LAB — CBC WITH DIFFERENTIAL/PLATELET
Basophils Absolute: 0.1 10*3/uL (ref 0.0–0.1)
Basophils Relative: 0.7 % (ref 0.0–3.0)
Eosinophils Absolute: 0.1 10*3/uL (ref 0.0–0.7)
Eosinophils Relative: 1.4 % (ref 0.0–5.0)
HCT: 44.1 % (ref 36.0–46.0)
Hemoglobin: 14.4 g/dL (ref 12.0–15.0)
Lymphocytes Relative: 29.3 % (ref 12.0–46.0)
Lymphs Abs: 2.1 10*3/uL (ref 0.7–4.0)
MCHC: 32.6 g/dL (ref 30.0–36.0)
MCV: 91.9 fl (ref 78.0–100.0)
Monocytes Absolute: 0.5 10*3/uL (ref 0.1–1.0)
Monocytes Relative: 7.2 % (ref 3.0–12.0)
Neutro Abs: 4.4 10*3/uL (ref 1.4–7.7)
Neutrophils Relative %: 61.4 % (ref 43.0–77.0)
Platelets: 330 10*3/uL (ref 150.0–400.0)
RBC: 4.8 Mil/uL (ref 3.87–5.11)
RDW: 13.2 % (ref 11.5–15.5)
WBC: 7.1 10*3/uL (ref 4.0–10.5)

## 2018-09-30 LAB — COMPREHENSIVE METABOLIC PANEL
ALT: 19 U/L (ref 0–35)
AST: 16 U/L (ref 0–37)
Albumin: 4.2 g/dL (ref 3.5–5.2)
Alkaline Phosphatase: 79 U/L (ref 39–117)
BUN: 12 mg/dL (ref 6–23)
CO2: 25 mEq/L (ref 19–32)
Calcium: 9.1 mg/dL (ref 8.4–10.5)
Chloride: 107 mEq/L (ref 96–112)
Creatinine, Ser: 0.85 mg/dL (ref 0.40–1.20)
GFR: 77.82 mL/min (ref >60.00)
Glucose, Bld: 90 mg/dL (ref 70–99)
Potassium: 4.1 mEq/L (ref 3.5–5.1)
Sodium: 139 mEq/L (ref 135–145)
Total Bilirubin: 0.7 mg/dL (ref 0.2–1.2)
Total Protein: 6.9 g/dL (ref 6.0–8.3)

## 2018-09-30 LAB — LIPID PANEL
Cholesterol: 160 mg/dL (ref 0–200)
HDL: 51.3 mg/dL (ref >39.00)
LDL Cholesterol: 86 mg/dL (ref 0–99)
NonHDL: 108.33
Total CHOL/HDL Ratio: 3
Triglycerides: 114 mg/dL (ref 0.0–149.0)
VLDL: 22.8 mg/dL (ref 0.0–40.0)

## 2018-09-30 NOTE — Patient Instructions (Addendum)
Please return in 12 months for your annual complete physical; please come fasting.  I will release your lab results to you on your MyChart account with further instructions. Please reply with any questions.    If you have any questions or concerns, please don't hesitate to send me a message via MyChart or call the office at [phone]. Thank you for visiting with [place] today! It's our pleasure caring for you.   Preventive Care 70-31 Years Old, Female Preventive care refers to visits with your health care provider and lifestyle choices that can promote health and wellness. This includes:  A yearly physical exam. This may also be called an annual well check.  Regular dental visits and eye exams.  Immunizations.  Screening for certain conditions.  Healthy lifestyle choices, such as eating a healthy diet, getting regular exercise, not using drugs or products that contain nicotine and tobacco, and limiting alcohol use. What can I expect for my preventive care visit? Physical exam Your health care provider will check your:  Height and weight. This may be used to calculate body mass index (BMI), which tells if you are at a healthy weight.  Heart rate and blood pressure.  Skin for abnormal spots. Counseling Your health care provider may ask you questions about your:  Alcohol, tobacco, and drug use.  Emotional well-being.  Home and relationship well-being.  Sexual activity.  Eating habits.  Work and work Statistician.  Method of birth control.  Menstrual cycle.  Pregnancy history. What immunizations do I need?  Influenza (flu) vaccine  This is recommended every year. Tetanus, diphtheria, and pertussis (Tdap) vaccine  You may need a Td booster every 10 years. Varicella (chickenpox) vaccine  You may need this if you have not been vaccinated. Human papillomavirus (HPV) vaccine  If recommended by your health care provider, you may need three doses over 6 months.  Measles, mumps, and rubella (MMR) vaccine  You may need at least one dose of MMR. You may also need a second dose. Meningococcal conjugate (MenACWY) vaccine  One dose is recommended if you are age 74-21 years and a first-year college student living in a residence hall, or if you have one of several medical conditions. You may also need additional booster doses. Pneumococcal conjugate (PCV13) vaccine  You may need this if you have certain conditions and were not previously vaccinated. Pneumococcal polysaccharide (PPSV23) vaccine  You may need one or two doses if you smoke cigarettes or if you have certain conditions. Hepatitis A vaccine  You may need this if you have certain conditions or if you travel or work in places where you may be exposed to hepatitis A. Hepatitis B vaccine  You may need this if you have certain conditions or if you travel or work in places where you may be exposed to hepatitis B. Haemophilus influenzae type b (Hib) vaccine  You may need this if you have certain conditions. You may receive vaccines as individual doses or as more than one vaccine together in one shot (combination vaccines). Talk with your health care provider about the risks and benefits of combination vaccines. What tests do I need?  Blood tests  Lipid and cholesterol levels. These may be checked every 5 years starting at age 62.  Hepatitis C test.  Hepatitis B test. Screening  Diabetes screening. This is done by checking your blood sugar (glucose) after you have not eaten for a while (fasting).  Sexually transmitted disease (STD) testing.  BRCA-related cancer screening. This may  be done if you have a family history of breast, ovarian, tubal, or peritoneal cancers.  Pelvic exam and Pap test. This may be done every 3 years starting at age 79. Starting at age 34, this may be done every 5 years if you have a Pap test in combination with an HPV test. Talk with your health care provider about  your test results, treatment options, and if necessary, the need for more tests. Follow these instructions at home: Eating and drinking   Eat a diet that includes fresh fruits and vegetables, whole grains, lean protein, and low-fat dairy.  Take vitamin and mineral supplements as recommended by your health care provider.  Do not drink alcohol if: ? Your health care provider tells you not to drink. ? You are pregnant, may be pregnant, or are planning to become pregnant.  If you drink alcohol: ? Limit how much you have to 0-1 drink a day. ? Be aware of how much alcohol is in your drink. In the [place] one drink equals one 12 oz bottle of beer (355 mL), one 5 oz glass of wine (148 mL), or one 1 oz glass of hard liquor (44 mL). Lifestyle  Take daily care of your teeth and gums.  Stay active. Exercise for at least 30 minutes on 5 or more days each week.  Do not use any products that contain nicotine or tobacco, such as cigarettes, e-cigarettes, and chewing tobacco. If you need help quitting, ask your health care provider.  If you are sexually active, practice safe sex. Use a condom or other form of birth control (contraception) in order to prevent pregnancy and STIs (sexually transmitted infections). If you plan to become pregnant, see your health care provider for a preconception visit. What's next?  Visit your health care provider once a year for a well check visit.  Ask your health care provider how often you should have your eyes and teeth checked.  Stay up to date on all vaccines. This information is not intended to replace advice given to you by your health care provider. Make sure you discuss any questions you have with your health care provider. Document Released: 04/16/2001 Document Revised: 10/30/2017 Document Reviewed: 10/30/2017 Elsevier Patient Education  2020 [place] and Cholesterol Restricted Eating Plan Getting too much fat and cholesterol in your diet may  cause health problems. Choosing the right foods helps keep your fat and cholesterol at normal levels. This can keep you from getting certain diseases. Your doctor may recommend an eating plan that includes:  Total fat: ______% or less of total calories a day.  Saturated fat: ______% or less of total calories a day.  Cholesterol: less than _________mg a day.  Fiber: ______g a day. What are tips for following this plan? Meal planning  At meals, divide your plate into four equal parts: ? Fill one-half of your plate with vegetables and green salads. ? Fill one-fourth of your plate with whole grains. ? Fill one-fourth of your plate with low-fat (lean) protein foods.  Eat fish that is high in omega-3 fats at least two times a week. This includes mackerel, tuna, sardines, and salmon.  Eat foods that are high in fiber, such as whole grains, beans, apples, broccoli, carrots, peas, and barley. General tips   Work with your doctor to lose weight if you need to.  Avoid: ? Foods with added sugar. ? Fried foods. ? Foods with partially hydrogenated oils.  Limit alcohol intake to no  more than 1 drink a day for nonpregnant women and 2 drinks a day for men. One drink equals 12 oz of beer, 5 oz of wine, or 1 oz of hard liquor. Reading food labels  Check food labels for: ? Trans fats. ? Partially hydrogenated oils. ? Saturated fat (g) in each serving. ? Cholesterol (mg) in each serving. ? Fiber (g) in each serving.  Choose foods with healthy fats, such as: ? Monounsaturated fats. ? Polyunsaturated fats. ? Omega-3 fats.  Choose grain products that have whole grains. Look for the word "whole" as the first word in the ingredient list. Cooking  Cook foods using low-fat methods. These include baking, boiling, grilling, and broiling.  Eat more home-cooked foods. Eat at restaurants and buffets less often.  Avoid cooking using saturated fats, such as butter, cream, palm oil, palm kernel  oil, and coconut oil. Recommended foods  Fruits  All fresh, canned (in natural juice), or frozen fruits. Vegetables  Fresh or frozen vegetables (raw, steamed, roasted, or grilled). Green salads. Grains  Whole grains, such as whole wheat or whole grain breads, crackers, cereals, and pasta. Unsweetened oatmeal, bulgur, barley, quinoa, or brown rice. Corn or whole wheat flour tortillas. Meats and other protein foods  Ground beef (85% or leaner), grass-fed beef, or beef trimmed of fat. Skinless chicken or [place]. Ground chicken or [place]. Pork trimmed of fat. All fish and seafood. Egg whites. Dried beans, peas, or lentils. Unsalted nuts or seeds. Unsalted canned beans. Nut butters without added sugar or oil. Dairy  Low-fat or nonfat dairy products, such as skim or 1% milk, 2% or reduced-fat cheeses, low-fat and fat-free ricotta or cottage cheese, or plain low-fat and nonfat yogurt. Fats and oils  Tub margarine without trans fats. Light or reduced-fat mayonnaise and salad dressings. Avocado. Olive, canola, sesame, or safflower oils. The items listed above may not be a complete list of foods and beverages you can eat. Contact a dietitian for more information. Foods to avoid Fruits  Canned fruit in heavy syrup. Fruit in cream or butter sauce. Fried fruit. Vegetables  Vegetables cooked in cheese, cream, or butter sauce. Fried vegetables. Grains  White bread. White pasta. White rice. Cornbread. Bagels, pastries, and croissants. Crackers and snack foods that contain trans fat and hydrogenated oils. Meats and other protein foods  Fatty cuts of meat. Ribs, chicken wings, bacon, sausage, bologna, salami, chitterlings, fatback, hot dogs, bratwurst, and packaged lunch meats. Liver and organ meats. Whole eggs and egg yolks. Chicken and [place] with skin. Fried meat. Dairy  Whole or 2% milk, cream, half-and-half, and cream cheese. Whole milk cheeses. Whole-fat or sweetened yogurt. Full-fat  cheeses. Nondairy creamers and whipped toppings. Processed cheese, cheese spreads, and cheese curds. Beverages  Alcohol. Sugar-sweetened drinks such as sodas, lemonade, and fruit drinks. Fats and oils  Butter, stick margarine, lard, shortening, ghee, or bacon fat. Coconut, palm kernel, and palm oils. Sweets and desserts  Corn syrup, sugars, honey, and molasses. Candy. Jam and jelly. Syrup. Sweetened cereals. Cookies, pies, cakes, donuts, muffins, and ice cream. The items listed above may not be a complete list of foods and beverages you should avoid. Contact a dietitian for more information. Summary  Choosing the right foods helps keep your fat and cholesterol at normal levels. This can keep you from getting certain diseases.  At meals, fill one-half of your plate with vegetables and green salads.  Eat high-fiber foods, like whole grains, beans, apples, carrots, peas, and barley.  Limit added sugar, saturated  fats, alcohol, and fried foods. This information is not intended to replace advice given to you by your health care provider. Make sure you discuss any questions you have with your health care provider. Document Released: 08/20/2011 Document Revised: 10/22/2017 Document Reviewed: 11/05/2016 Elsevier Patient Education  2020 [employer].

## 2018-09-30 NOTE — Progress Notes (Signed)
Subjective  Chief Complaint  Patient presents with  . Annual Exam    She is fasting    HPI: Grace Moore is a 33 y.o. female who presents to [facility] today for a Female Wellness Visit.   Wellness Visit: annual visit with health maintenance review and exam without Pap   Healthy 33-year-old female here for complete physical without Pap smear.  No concerns.  Well-controlled GERD.  Rare migraine.  Working on weight loss with daily exercise program.  Assessment  1. Annual physical exam   2. Gastroesophageal reflux disease without esophagitis   3. Migraine without aura and without status migrainosus, not intractable   4. Morbid obesity (HCC)      Plan  Female Wellness Visit:  Age appropriate Health Maintenance and Prevention measures were discussed with patient. Included topics are cancer screening recommendations, ways to keep healthy (see AVS) including dietary and exercise recommendations, regular eye and dental care, use of seat belts, and avoidance of moderate alcohol use and tobacco use.   BMI: discussed patient's BMI and encouraged positive lifestyle modifications to help get to or maintain a target BMI.  HM needs and immunizations were addressed and ordered. See below for orders. See HM and immunization section for updates.  Routine labs and screening tests ordered including cmp, cbc and lipids where appropriate.  Discussed recommendations regarding Vit D and calcium supplementation (see AVS)  Follow up: Return in about 1 year (around 09/30/2019) for complete physical.   Orders Placed This Encounter  Procedures  . CBC with Differential/Platelet  . Comprehensive metabolic panel  . Lipid panel   No orders of the defined types were placed in this encounter.     Lifestyle: Body mass index is 43.92 kg/m. Wt Readings from Last 3 Encounters:  09/30/18 244 lb (110.7 kg)  01/09/18 243 lb (110.2 kg)  12/12/17 244 lb 6.4 oz (110.9 kg)    Diet: general Exercise: frequently, walking Need for contraception: No, tubal ligation  Patient Active Problem List   Diagnosis Date Noted  . GERD (gastroesophageal reflux disease) 05/22/2017    Normal Barium swallow and tablet test 12/2017   . Migraine headache 05/22/2017  . Morbid obesity (HCC) 05/22/2017  . Status post repeat low transverse cesarean section & BTL 08/27/2014   Health Maintenance  Topic Date Due  . INFLUENZA VACCINE  10/03/2018  . PAP SMEAR-Modifier  05/08/2020  . TETANUS/TDAP  06/07/2024  . HIV Screening  Completed   Immunization History  Administered Date(s) Administered  . DTaP 07/18/1987, 09/18/1987, 11/21/1987, 12/19/1988, 09/27/1992  . HPV Quadrivalent 03/04/2011, 05/02/2011, 09/02/2011  . Hepatitis B 12/18/1998, 02/01/1999, 06/25/1999  . IPV 07/18/1987, 09/18/1987, 12/19/1988, 09/27/1992  . Influenza,inj,Quad PF,6+ Mos 11/06/2012, 12/10/2013, 11/09/2015, 03/03/2017, 12/12/2017  . Influenza-Unspecified 03/10/2017  . MMR 09/05/1988, 09/27/1988  . Td 08/19/2005, 08/19/2005  . Tdap 06/08/2014   We updated and reviewed the patient's past history in detail and it is documented below. Allergies: Patient has No Known Allergies. Past Medical History Patient  has a past medical history of GERD (gastroesophageal reflux disease), Headache, and History of kidney stones. Past Surgical History Patient  has a past surgical history that includes Wrist surgery; Cesarean section (11/2009); Cholecystectomy; Wisdom tooth extraction; and Cesarean section with bilateral tubal ligation (Bilateral, 08/27/2014). Family History: Patient family history includes AAA (abdominal aortic aneurysm) in her father; Arthritis in her maternal grandmother; Asthma in her son; Breast cancer in her maternal grandmother; Diabetes in her maternal grandfather; Healthy in   her daughter; Heart attack in her father; Hyperlipidemia in her father; Hypertension in her father and mother; Kidney disease  in her maternal grandmother; Stroke in her father. Social History:  Patient  reports that she has never smoked. She has never used smokeless tobacco. She reports that she does not drink alcohol or use drugs.  Review of Systems: Constitutional: negative for fever or malaise Ophthalmic: negative for photophobia, double vision or loss of vision Cardiovascular: negative for chest pain, dyspnea on exertion, or new LE swelling Respiratory: negative for SOB or persistent cough Gastrointestinal: negative for abdominal pain, change in bowel habits or melena Genitourinary: negative for dysuria or gross hematuria, no abnormal uterine bleeding or disharge Musculoskeletal: negative for new gait disturbance or muscular weakness Integumentary: negative for new or persistent rashes, no breast lumps Neurological: negative for TIA or stroke symptoms Psychiatric: negative for SI or delusions Allergic/Immunologic: negative for hives Patient Care Team    Relationship Specialty Notifications Start End  Leamon Arnt, MD PCP - General Family Medicine  05/22/17   Aloha Gell, MD Consulting Physician Obstetrics and Gynecology  05/22/17   Orie Rout, MD Consulting Physician Neurology  05/22/17   Posey Pronto, [place] Physician Dentistry  05/22/17   [facility]    05/22/17     Objective  Vitals: BP 120/80   Pulse 78   Temp 98.5 F (36.9 C) (Oral)   Resp 16   Ht 5' 2.5" (1.588 m)   Wt 244 lb (110.7 kg)   LMP 09/29/2018   SpO2 98%   BMI 43.92 kg/m  General:  Well developed, well nourished, no acute distress  Psych:  Alert and orientedx3,normal mood and affect HEENT:  Normocephalic, atraumatic, non-icteric sclera, PERRL, oropharynx is clear without mass or exudate, supple neck without adenopathy, mass or thyromegaly Cardiovascular:  Normal S1, S2, RRR without gallop, rub or murmur, nondisplaced PMI Respiratory:  Good breath sounds bilaterally, CTAB with normal respiratory effort  Gastrointestinal: normal bowel sounds, soft, non-tender, no noted masses. No HSM MSK: no deformities, contusions. Joints are without erythema or swelling. Spine and CVA region are nontender Skin:  Warm, no rashes or suspicious lesions noted Neurologic:    Mental status is normal. CN 2-11 are normal. Gross motor and sensory exams are normal. Normal gait. No tremor Breast Exam: No mass, skin retraction or nipple discharge is appreciated in either breast. No axillary adenopathy. Fibrocystic changes are not noted    Commons side effects, risks, benefits, and alternatives for medications and treatment plan prescribed today were discussed, and the patient expressed understanding of the given instructions. Patient is instructed to call or message via MyChart if he/she has any questions or concerns regarding our treatment plan. No barriers to understanding were identified. We discussed Red Flag symptoms and signs in detail. Patient expressed understanding regarding what to do in case of urgent or emergency type symptoms.   Medication list was reconciled, printed and provided to the patient in AVS. Patient instructions and summary information was reviewed with the patient as documented in the AVS. This note was prepared with assistance of Dragon voice recognition software. Occasional wrong-word or sound-a-like substitutions may have occurred due to the inherent limitations of voice recognition software

## 2018-10-13 ENCOUNTER — Other Ambulatory Visit: Payer: Self-pay | Admitting: Family Medicine

## 2019-08-09 ENCOUNTER — Ambulatory Visit: Payer: BC Managed Care – PPO | Admitting: Family Medicine

## 2019-09-24 ENCOUNTER — Other Ambulatory Visit: Payer: Self-pay | Admitting: Family Medicine

## 2019-12-23 ENCOUNTER — Other Ambulatory Visit: Payer: Self-pay | Admitting: Family Medicine

## 2019-12-24 NOTE — Telephone Encounter (Signed)
Please call patient: need to decrease dose of nexium. Will decrease to 40mg  daily. After 3 months, if stable, would like to try 20mg  daily.  She can let me know via mychart how it is going before getting next refill. Thanks.

## 2020-01-07 ENCOUNTER — Ambulatory Visit (INDEPENDENT_AMBULATORY_CARE_PROVIDER_SITE_OTHER): Payer: BC Managed Care – PPO | Admitting: Family Medicine

## 2020-01-07 ENCOUNTER — Encounter: Payer: Self-pay | Admitting: Family Medicine

## 2020-01-07 ENCOUNTER — Other Ambulatory Visit: Payer: Self-pay

## 2020-01-07 VITALS — BP 120/88 | HR 71 | Temp 98.6°F | Ht 63.0 in | Wt 238.0 lb

## 2020-01-07 DIAGNOSIS — Z0000 Encounter for general adult medical examination without abnormal findings: Secondary | ICD-10-CM

## 2020-01-07 DIAGNOSIS — Z0001 Encounter for general adult medical examination with abnormal findings: Secondary | ICD-10-CM

## 2020-01-07 DIAGNOSIS — M7918 Myalgia, other site: Secondary | ICD-10-CM

## 2020-01-07 DIAGNOSIS — Z1159 Encounter for screening for other viral diseases: Secondary | ICD-10-CM

## 2020-01-07 DIAGNOSIS — L723 Sebaceous cyst: Secondary | ICD-10-CM

## 2020-01-07 DIAGNOSIS — F4321 Adjustment disorder with depressed mood: Secondary | ICD-10-CM

## 2020-01-07 LAB — CBC WITH DIFFERENTIAL/PLATELET
Absolute Monocytes: 403 cells/uL (ref 200–950)
Basophils Absolute: 31 cells/uL (ref 0–200)
Eosinophils Absolute: 61 cells/uL (ref 15–500)

## 2020-01-07 LAB — COMPLETE METABOLIC PANEL WITH GFR: Alkaline phosphatase (APISO): 74 U/L (ref 31–125)

## 2020-01-07 LAB — LIPID PANEL: Cholesterol: 165 mg/dL (ref <200)

## 2020-01-07 NOTE — Patient Instructions (Addendum)
Please return in 12 months for your annual complete physical; please come fasting.  I will release your lab results to you on your MyChart account with further instructions. Please reply with any questions.   I have referred you for physical therapy. We will call you with an appointment.   If you have any questions or concerns, please don't hesitate to send me a message via MyChart or call the office at [phone]. Thank you for visiting with Grace Moore today! It's our pleasure caring for you.   Preventive Care 32-32 Years Old, Female Preventive care refers to visits with your health care provider and lifestyle choices that can promote health and wellness. This includes:  A yearly physical exam. This may also be called an annual well check.  Regular dental visits and eye exams.  Immunizations.  Screening for certain conditions.  Healthy lifestyle choices, such as eating a healthy diet, getting regular exercise, not using drugs or products that contain nicotine and tobacco, and limiting alcohol use. What can I expect for my preventive care visit? Physical exam Your health care provider will check your:  Height and weight. This may be used to calculate body mass index (BMI), which tells if you are at a healthy weight.  Heart rate and blood pressure.  Skin for abnormal spots. Counseling Your health care provider may ask you questions about your:  Alcohol, tobacco, and drug use.  Emotional well-being.  Home and relationship well-being.  Sexual activity.  Eating habits.  Work and work Statistician.  Method of birth control.  Menstrual cycle.  Pregnancy history. What immunizations do I need?  Influenza (flu) vaccine  This is recommended every year. Tetanus, diphtheria, and pertussis (Tdap) vaccine  You may need a Td booster every 10 years. Varicella (chickenpox) vaccine  You may need this if you have not been vaccinated. Human papillomavirus (HPV) vaccine  If  recommended by your health care provider, you may need three doses over 6 months. Measles, mumps, and rubella (MMR) vaccine  You may need at least one dose of MMR. You may also need a second dose. Meningococcal conjugate (MenACWY) vaccine  One dose is recommended if you are age 78-21 years and a first-year college student living in a residence hall, or if you have one of several medical conditions. You may also need additional booster doses. Pneumococcal conjugate (PCV13) vaccine  You may need this if you have certain conditions and were not previously vaccinated. Pneumococcal polysaccharide (PPSV23) vaccine  You may need one or two doses if you smoke cigarettes or if you have certain conditions. Hepatitis A vaccine  You may need this if you have certain conditions or if you travel or work in places where you may be exposed to hepatitis A. Hepatitis B vaccine  You may need this if you have certain conditions or if you travel or work in places where you may be exposed to hepatitis B. Haemophilus influenzae type b (Hib) vaccine  You may need this if you have certain conditions. You may receive vaccines as individual doses or as more than one vaccine together in one shot (combination vaccines). Talk with your health care provider about the risks and benefits of combination vaccines. What tests do I need?  Blood tests  Lipid and cholesterol levels. These may be checked every 5 years starting at age 18.  Hepatitis C test.  Hepatitis B test. Screening  Diabetes screening. This is done by checking your blood sugar (glucose) after you have not eaten for  a while (fasting).  Sexually transmitted disease (STD) testing.  BRCA-related cancer screening. This may be done if you have a family history of breast, ovarian, tubal, or peritoneal cancers.  Pelvic exam and Pap test. This may be done every 3 years starting at age 42. Starting at age 3, this may be done every 5 years if you have a  Pap test in combination with an HPV test. Talk with your health care provider about your test results, treatment options, and if necessary, the need for more tests. Follow these instructions at home: Eating and drinking   Eat a diet that includes fresh fruits and vegetables, whole grains, lean protein, and low-fat dairy.  Take vitamin and mineral supplements as recommended by your health care provider.  Do not drink alcohol if: ? Your health care provider tells you not to drink. ? You are pregnant, may be pregnant, or are planning to become pregnant.  If you drink alcohol: ? Limit how much you have to 0-1 drink a day. ? Be aware of how much alcohol is in your drink. In the [place] one drink equals one 12 oz bottle of beer (355 mL), one 5 oz glass of wine (148 mL), or one 1 oz glass of hard liquor (44 mL). Lifestyle  Take daily care of your teeth and gums.  Stay active. Exercise for at least 30 minutes on 5 or more days each week.  Do not use any products that contain nicotine or tobacco, such as cigarettes, e-cigarettes, and chewing tobacco. If you need help quitting, ask your health care provider.  If you are sexually active, practice safe sex. Use a condom or other form of birth control (contraception) in order to prevent pregnancy and STIs (sexually transmitted infections). If you plan to become pregnant, see your health care provider for a preconception visit. What's next?  Visit your health care provider once a year for a well check visit.  Ask your health care provider how often you should have your eyes and teeth checked.  Stay up to date on all vaccines. This information is not intended to replace advice given to you by your health care provider. Make sure you discuss any questions you have with your health care provider. Document Revised: 10/30/2017 Document Reviewed: 10/30/2017 Elsevier Patient Education  2020 [employer].

## 2020-01-07 NOTE — Progress Notes (Signed)
Subjective  Chief Complaint  Patient presents with  . Annual Exam    non-fasting  . Cyst    mulitple on her scalp  . Shoulder Pain    right  . Depression    mother recently passed several months ago    HPI: Grace Moore is a 34 y.o. female who presents to [facility] today for a Female Wellness Visit.  She also has the concerns and/or needs as listed above in the chief complaint. These will be addressed in addition to the Health Maintenance Visit.   Wellness Visit: annual visit with health maintenance review and exam without Pap   HM: sees gyn for female wellness. Doing ok. Mom passed in July: unclear cause; told possible undiagnosed neurologic disorder. She was 61.family had to make decision to take off of life support. Pt is doing fairly well; greiving appropriately; 10yo daughter is seeing a therapist to help process / cope with the unexpected loss. imms up to date.   Chronic disease management visit and/or acute problem visit:  C/o painful/sore cyst on top of scalp. Denies redness, rash or drainage. ? Other cyst on right forehead.   C/o 3 years of right sided upper back pain, medial to shoulder blade. Started years ago after laying mulch. Denies injury. Hasn't done anything for it. Hurts daily but doesn't interfere with activities. 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 No shoulder or UE pain. No neck pain. No radicular sxs. No weakness. Prefers to NOT take meds.    Assessment  1. Annual physical exam   2. Need for hepatitis C screening test   3. Morbid obesity (HCC) Chronic  4. Sebaceous cyst   5. Rhomboid muscle pain   6. Grief reaction      Plan  Female Wellness Visit:  Age appropriate Health Maintenance and Prevention measures were discussed with patient. Included topics are cancer screening recommendations, ways to keep healthy (see AVS) including dietary and exercise recommendations, regular eye and dental care,  use of seat belts, and avoidance of moderate alcohol use and tobacco use.   BMI: discussed patient's BMI and encouraged positive lifestyle modifications to help get to or maintain a target BMI.  HM needs and immunizations were addressed and ordered. See below for orders. See HM and immunization section for updates.  Routine labs and screening tests ordered including cmp, cbc and lipids where appropriate.  Discussed recommendations regarding Vit D and calcium supplementation (see AVS)  Chronic disease f/u and/or acute problem visit: (deemed necessary to be done in addition to the wellness visit):  Rhomboid mm pain, chronic:  Refer to PT. Consider imaging if not improving.   Sebaceous cyst,scalp: reassured. Monitor.   Grief: appropriate. Monitor. Return if worsening.    Follow up: Return in about 1 year (around 01/06/2021) for complete physical.   Orders Placed This Encounter  Procedures  . CBC with Differential/Platelet  . COMPLETE METABOLIC PANEL WITH GFR  . Lipid panel  . TSH  . Hepatitis C antibody  . Ambulatory referral to Physical Therapy   No orders of the defined types were placed in this encounter.     Lifestyle: Body mass index is 42.16 kg/m. Wt Readings from Last 3 Encounters:  01/07/20 238 lb (108 kg)  09/30/18 244 lb (110.7 kg)  01/09/18 243 lb (110.2 kg)     Patient Active Problem List   Diagnosis Date Noted  . GERD (gastroesophageal reflux disease) 05/22/2017    Normal  Barium swallow and tablet test 12/2017   . Migraine headache 05/22/2017  . Morbid obesity ([place]) 05/22/2017  . Status post repeat low transverse cesarean section & BTL 08/27/2014   Health Maintenance  Topic Date Due  . Hepatitis C Screening  Never done  . PAP SMEAR-Modifier  05/08/2020  . TETANUS/TDAP  06/07/2024  . INFLUENZA VACCINE  Completed  . COVID-19 Vaccine  Completed  . HIV Screening  Completed   Immunization History  Administered Date(s) Administered  . DTaP  07/18/1987, 09/18/1987, 11/21/1987, 12/19/1988, 09/27/1992  . HPV Quadrivalent 03/04/2011, 05/02/2011, 09/02/2011  . Hepatitis B 12/18/1998, 02/01/1999, 06/25/1999  . IPV 07/18/1987, 09/18/1987, 12/19/1988, 09/27/1992  . Influenza,inj,Quad PF,6+ Mos 11/06/2012, 12/10/2013, 11/09/2015, 03/03/2017, 12/12/2017  . Influenza-Unspecified 03/10/2017, 12/31/2019  . MMR 09/05/1988, 09/27/1988  . Moderna SARS-COVID-2 Vaccination 04/30/2019, 06/01/2019  . Td 08/19/2005, 08/19/2005  . Tdap 06/08/2014   We updated and reviewed the patient's past history in detail and it is documented below. Allergies: Patient  reports no history of alcohol use. Past Medical History Patient  has a past medical history of GERD (gastroesophageal reflux disease) and History of kidney stones. Past Surgical History Patient  has a past surgical history that includes Wrist surgery; Cesarean section (11/2009); Cholecystectomy; Wisdom tooth extraction; and Cesarean section with bilateral tubal ligation (Bilateral, 08/27/2014). Social History   Socioeconomic History  . Marital status: Married    Spouse name: Audelia Acton  . Number of children: 2  . Years of education: Not on file  . Highest education level: Not on file  Occupational History  . 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 Smoking status: Never Smoker  . Smokeless tobacco: Never Used  Vaping Use  . Vaping Use: Never used  Substance and Sexual Activity  . Alcohol use: No  . Drug use: No  . Sexual activity: Yes    Birth control/protection: Surgical    Comment: BTL  Other Topics Concern  . Not on file  Social History Narrative  . Not on file   Social Determinants of Health   Financial Resource Strain:   . Difficulty of Paying Living Expenses: Not on file  Food Insecurity:   . Worried About Charity fundraiser in the Last Year: Not on file  . Ran Out of  Food in the Last Year: Not on file  Transportation Needs:   . Lack of Transportation (Medical): Not on file  . Lack of Transportation (Non-Medical): Not on file  Physical Activity:   . Days of Exercise per Week: Not on file  . Minutes of Exercise per Session: Not on file  Stress:   . Feeling of Stress : Not on file  Social Connections:   . Frequency of Communication with Friends and Family: Not on file  . Frequency of Social Gatherings with Friends and Family: Not on file  . Attends Religious Services: Not on file  . Active Member of Clubs or Organizations: Not on file  . Attends Archivist Meetings: Not on file  . Marital Status: Not on file   Family History  Problem Relation Age of Onset  . Healthy Daughter   . Asthma Son   . Hypertension Mother   . AAA (abdominal aortic aneurysm) Father   . Stroke Father   . Hypertension Father   . Hyperlipidemia Father   . Heart attack Father   . Breast  cancer Maternal Grandmother   . Arthritis Maternal Grandmother   . Kidney disease Maternal Grandmother   . Diabetes Maternal Grandfather     Review of Systems: Constitutional: negative for fever or malaise Ophthalmic: negative for photophobia, double vision or loss of vision Cardiovascular: negative for chest pain, dyspnea on exertion, or new LE swelling Respiratory: negative for SOB or persistent cough Gastrointestinal: negative for abdominal pain, change in bowel habits or melena Genitourinary: negative for dysuria or gross hematuria, no abnormal uterine bleeding or disharge Musculoskeletal: negative for new gait disturbance or muscular weakness Integumentary: negative for new or persistent rashes, no breast lumps Neurological: negative for TIA or stroke symptoms Psychiatric: negative for SI or delusions Allergic/Immunologic: negative for hives  Patient Care Team    Relationship Specialty Notifications Start End  Leamon Arnt, MD PCP - General Family Medicine  05/22/17    Aloha Gell, MD Consulting Physician Obstetrics and Gynecology  05/22/17   Orie Rout, MD Consulting Physician Neurology  05/22/17   Posey Pronto, [place] Physician Dentistry  05/22/17   [facility]    05/22/17     Objective  Vitals: BP 120/88   Pulse 71   Temp 98.6 F (37 C) (Temporal)   Ht 5' 3"  (1.6 m)   Wt 238 lb (108 kg)   LMP 01/07/2020 (Exact Date)   SpO2 98%   BMI 42.16 kg/m  General:  Well developed, well nourished, no acute distress  Psych:  Alert and orientedx3,normal mood and affect HEENT:  Normocephalic, atraumatic, non-icteric sclera, PERRL, supple neck without adenopathy, mass or thyromegaly Cardiovascular:  Normal S1, S2, RRR without gallop, rub or murmur Respiratory:  Good breath sounds bilaterally, CTAB with normal respiratory effort Gastrointestinal: normal bowel sounds, soft, non-tender, no noted masses. No HSM MSK: no deformities, contusions. Joints are without erythema or swelling. Tender over right rhomboid. Nl right shoulder. No spinal ttp. No mm spasm or mass.   Skin:  Warm, no rashes or suspicious lesions noted Neurologic:    Mental status is normal. Gross motor and sensory exams are normal. Normal gait. No tremor     Commons side effects, risks, benefits, and alternatives for medications and treatment plan prescribed today were discussed, and the patient expressed understanding of the given instructions. Patient is instructed to call or message via MyChart if he/she has any questions or concerns regarding our treatment plan. No barriers to understanding were identified. We discussed Red Flag symptoms and signs in detail. Patient expressed understanding regarding what to do in case of urgent or emergency type symptoms.   Medication list was reconciled, printed and provided to the patient in AVS. Patient instructions and summary information was reviewed with the patient as documented in the AVS. This note was prepared with assistance of  Dragon voice recognition software. Occasional wrong-word or sound-a-like substitutions may have occurred due to the inherent limitations of voice recognition software  This visit occurred during the SARS-CoV-2 public health emergency.  Safety protocols were in place, including screening questions prior to the visit, additional usage of staff PPE, and extensive cleaning of exam room while observing appropriate contact time as indicated for disinfecting solutions.

## 2020-01-08 LAB — CBC WITH DIFFERENTIAL/PLATELET
MCH: 26.8 pg — ABNORMAL LOW (ref 27.0–33.0)
MCV: 82.5 fL (ref 80.0–100.0)

## 2020-01-10 LAB — CBC WITH DIFFERENTIAL/PLATELET
Basophils Relative: 0.5 %
Eosinophils Relative: 1 %
HCT: 35.4 % (ref 35.0–45.0)
Hemoglobin: 11.5 g/dL — ABNORMAL LOW (ref 11.7–15.5)
Lymphs Abs: 2214 cells/uL (ref 850–3900)
MCHC: 32.5 g/dL (ref 32.0–36.0)
MPV: 10.1 fL (ref 7.5–12.5)
Monocytes Relative: 6.6 %
Neutro Abs: 3392 cells/uL (ref 1500–7800)
Neutrophils Relative %: 55.6 %
Platelets: 391 10*3/uL (ref 140–400)
RBC: 4.29 10*6/uL (ref 3.80–5.10)
RDW: 13.5 % (ref 11.0–15.0)
Total Lymphocyte: 36.3 %
WBC: 6.1 10*3/uL (ref 3.8–10.8)

## 2020-01-10 LAB — COMPLETE METABOLIC PANEL WITH GFR
AG Ratio: 1.5 (calc) (ref 1.0–2.5)
ALT: 16 U/L (ref 6–29)
AST: 14 U/L (ref 10–30)
Albumin: 4.1 g/dL (ref 3.6–5.1)
BUN: 10 mg/dL (ref 7–25)
CO2: 24 mmol/L (ref 20–32)
Calcium: 9.1 mg/dL (ref 8.6–10.2)
Chloride: 106 mmol/L (ref 98–110)
Creat: 0.82 mg/dL (ref 0.50–1.10)
GFR, Est African American: 110 mL/min/{1.73_m2} (ref >=60)
GFR, Est Non African American: 95 mL/min/{1.73_m2} (ref >=60)
Globulin: 2.7 g/dL (calc) (ref 1.9–3.7)
Glucose, Bld: 86 mg/dL (ref 65–99)
Potassium: 4.5 mmol/L (ref 3.5–5.3)
Sodium: 138 mmol/L (ref 135–146)
Total Bilirubin: 0.3 mg/dL (ref 0.2–1.2)
Total Protein: 6.8 g/dL (ref 6.1–8.1)

## 2020-01-10 LAB — TSH: TSH: 1.72 mIU/L

## 2020-01-10 LAB — LIPID PANEL
HDL: 59 mg/dL (ref >=50)
LDL Cholesterol (Calc): 90 mg/dL (calc)
Non-HDL Cholesterol (Calc): 106 mg/dL (calc) (ref <130)
Total CHOL/HDL Ratio: 2.8 (calc) (ref <5.0)
Triglycerides: 74 mg/dL (ref <150)

## 2020-01-10 LAB — HEPATITIS C ANTIBODY
Hepatitis C Ab: NONREACTIVE
SIGNAL TO CUT-OFF: 0.01 (ref <1.00)

## 2020-03-07 ENCOUNTER — Encounter: Payer: Self-pay | Admitting: Family Medicine

## 2020-03-08 ENCOUNTER — Other Ambulatory Visit: Payer: Self-pay

## 2020-03-08 MED ORDER — ESOMEPRAZOLE MAGNESIUM 40 MG PO CPDR: 40.0000 mg | DELAYED_RELEASE_CAPSULE | Freq: Two times a day (BID) | ORAL | 1 refills | Status: DC

## 2020-03-10 ENCOUNTER — Encounter: Payer: Self-pay | Admitting: Physical Therapy

## 2020-03-10 ENCOUNTER — Other Ambulatory Visit: Payer: Self-pay

## 2020-03-10 ENCOUNTER — Ambulatory Visit: Payer: BC Managed Care – PPO | Attending: Family Medicine | Admitting: Physical Therapy

## 2020-03-10 DIAGNOSIS — M6281 Muscle weakness (generalized): Secondary | ICD-10-CM | POA: Diagnosis present

## 2020-03-10 DIAGNOSIS — M546 Pain in thoracic spine: Secondary | ICD-10-CM | POA: Insufficient documentation

## 2020-03-10 DIAGNOSIS — R252 Cramp and spasm: Secondary | ICD-10-CM | POA: Diagnosis present

## 2020-03-10 NOTE — Therapy (Signed)
[facility] [address], [place], [place], [address] Phone: [phone]   Fax:  [phone]  Physical Therapy Evaluation  Patient Details  Name: Grace Moore MRN: [id number] Date of Birth: 10/24/1985 Referring Provider (PT): rhomboid muscle pain   Encounter Date: 03/10/2020   PT End of Session - 03/10/20 1052    Visit Number 1    Date for PT Re-Evaluation 05/05/20    Authorization Type BCBS    PT Start Time 4270    PT Stop Time 0844    PT Time Calculation (min) 47 min    Activity Tolerance Patient tolerated treatment well           Past Medical History:  Diagnosis Date  . GERD (gastroesophageal reflux disease)   . History of kidney stones    passed stone, no surgery    Past Surgical History:  Procedure Laterality Date  . CESAREAN SECTION  11/2009   [place]  . CESAREAN SECTION WITH BILATERAL TUBAL LIGATION Bilateral 08/27/2014   Procedure: REPEAT CESAREAN SECTION WITH BILATERAL TUBAL LIGATION ;  Surgeon: Aloha Gell, MD;  Location: [facility];  Service: Obstetrics;  Laterality: Bilateral;  EDD: 09/03/14  . CHOLECYSTECTOMY    . WISDOM TOOTH EXTRACTION    . WRIST SURGERY     x 2 one left and 1 right     There were no vitals filed for this visit.    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 no problem    How long can you walk comfortably? no problem    Diagnostic tests none    Patient Stated Goals figure how to get pain to stop; don't know what stretches to try    Currently in Pain? No/denies    Pain Score 0-No pain    Pain Location Scapula    Pain Orientation Right    Pain Type Chronic pain    Pain Onset More than a month ago    Pain Frequency Intermittent    Aggravating Factors  lifting;  bad mattress; sometimes wakes at night; raising arm overhead    Pain Relieving Factors heat, usually mornings OK              [facility] PT Assessment - 03/10/20 0001      Assessment   Referring Provider (PT) rhomboid muscle pain    Onset Date/Surgical Date --   3 years   Hand Dominance Right    Next MD Visit as needed    Prior Therapy bil wrist tendonitis      Precautions   Precautions None      Restrictions   Weight Bearing Restrictions No      Balance Screen   Has the patient fallen in the past 6 months No    Has the patient had a decrease in activity level because of a fear of falling?  No    Is the patient reluctant to leave their home because of a fear of falling?  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    Leisure spend time with family      Observation/Other Assessments   Focus on Therapeutic Outcomes (FOTO)  55% (goal 69%)      Posture/Postural Control   Posture/Postural Control Postural limitations    Posture Comments rounded shoulders      AROM   Overall AROM Comments UE ROM WFLS except pain producing with endrange flexion, abduction and internal rotation    Cervical Flexion WFLs    Cervical Extension WFLs    Cervical - Right Side Bend 38    Cervical - Left Side Bend 48    Cervical - Right Rotation 60    Cervical - Left Rotation 60      PROM   Overall PROM Comments thoracic hypomobility T1-T8; no discomfort with thoracic rotation with overpressure      Strength   Overall Strength Comments decreased strength of middle and lower traps 4/5      Palpation   Palpation comment numerous tender points in right rhomboids                      Objective measurements completed on examination: See above  findings.       [place] [facility] PT Treatment/Exercise - 03/10/20 0001      Moist Heat Therapy   Number Minutes Moist Heat 3 Minutes    Moist Heat Location --   right scapula     Manual Therapy   Soft tissue mobilization rhomboids, levator scap            Trigger Point Dry Needling - 03/10/20 0001    Consent Given? Yes    Education Handout Provided Yes    Muscles Treated Head and Neck Levator scapulae    Muscles Treated Upper Quadrant Rhomboids    Other Dry Needling right    Levator Scapulae Response Palpable increased muscle length    Rhomboids Response Palpable increased muscle length                PT Education - 03/10/20 1052    Education Details doorway rhomboid stretch; childs pose with foam roll    Person(s) Educated Patient    Methods Demonstration;Handout    Comprehension Returned demonstration;Verbalized understanding            PT Short Term Goals - 03/10/20 1103      PT SHORT TERM GOAL #1   Title The patient will demonstrate basic self care strategies and ex's for pain relief    Time 4    Period Weeks    Status New    Target Date 04/07/20      PT SHORT TERM GOAL #2   Title The patient will report a 30% reduction in right medial scapular pain 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    Time 4    Period Weeks    Status New             PT Long Term Goals - 03/10/20 1104      PT LONG TERM GOAL #1   Title The patient will be independent in safe self progression of HEP    Time 8    Period Weeks    Status New    Target Date 05/05/20      PT LONG TERM GOAL #2   Title The patient will report a 60% reduction in scapular pain with lifting 35 year olds at  work    Time 8    Period Weeks    Status New      PT LONG TERM GOAL #3   Title The patient will have improved middle and lower trap muscle strength to 4+/5 needed for lifting    Time 8    Period Weeks    Status New      PT LONG TERM GOAL #4   Title FOTO functional outcome score improved from  55% to 69%    Time 8    Period Weeks    Status New                  Plan - 03/10/20 1054    Clinical Impression Statement The patient has a 3 year history of right medial scapular pain initially caused by shoveling mulch.  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  Her cervical and thoracic ROM is WFLs except for right sidebending is limited.  Shoulder ROM is full but painful at endrange. Decreased activation of middle and lower traps with strength 4/5.  Numerous tender points in right rhomboid and levator scap muscles.  She would benefit from PT to address these issues.    Personal Factors and Comorbidities Time since onset of injury/illness/exacerbation;Profession    Examination-Activity Limitations Lift;Sleep;Carry;Reach Overhead    Examination-Participation Restrictions Occupation;Yard Work    Stability/Clinical Decision Making Stable/Uncomplicated    Clinical Decision Making Low    Rehab Potential Good    PT Frequency 1x / week    PT Duration 8 weeks    PT Treatment/Interventions ADLs/Self Care Home Management;Cryotherapy;Electrical Stimulation;Moist Heat;Iontophoresis 4mg /ml Dexamethasone;Ultrasound;Neuromuscular re-education;Therapeutic exercise;Therapeutic activities;Patient/family education;Manual techniques;Dry needling;Taping;Spinal Manipulations    PT Next Visit Plan assess response to DN #1 to rhomboids;  add ex's to HEP for thoracic extension and open books for rotation; start band ex's and/or foam roll ex's (she has one at home)    PT Home Exercise Plan [email or web address]    Consulted and Agree with Plan of Care Patient           Patient will benefit from skilled therapeutic intervention in order to improve the following deficits and impairments:  Increased fascial restricitons,Increased muscle spasms,Impaired UE functional use,Pain,Decreased  strength  Visit Diagnosis: Pain in thoracic spine - Plan: PT plan of care cert/re-cert  Cramp and spasm - Plan: PT plan of care cert/re-cert  Muscle weakness (generalized) - Plan: PT plan of care cert/re-cert     Problem List Patient Active Problem List   Diagnosis Date Noted  . GERD (gastroesophageal reflux disease) 05/22/2017  . Migraine headache 05/22/2017  . Morbid obesity ([place]) 05/22/2017  . Status post repeat low transverse cesarean section & BTL 08/27/2014   Ruben Im, PT 03/10/20 11:10 AM Phone: [phone] Fax: [phone] [name] 03/10/2020, 11:10 AM  [facility] [address] [place], [place], [address] Phone: [phone]   Fax:  [phone]  Name: Grace Moore MRN: [id number] Date of Birth: 20-Jun-1985

## 2020-03-10 NOTE — Patient Instructions (Signed)
Access Code: [email or web address] URL: [email or web address] Date: 03/10/2020 Prepared by: Ruben Im  Exercises Doorway Rhomboid Stretch - 1 x daily - 7 x weekly - 1 sets - 3 reps - 20 hold Thoracic Extension with Foam Roll - 1 x daily - 7 x weekly - 1 sets - 10 reps   Trigger Point Dry Needling  . What is Trigger Point Dry Needling (DN)? o DN is a physical therapy technique used to treat muscle pain and dysfunction. Specifically, DN helps deactivate muscle trigger points (muscle knots).  o A thin filiform needle is used to penetrate the skin and stimulate the underlying trigger point. The goal is for a local twitch response (LTR) to occur and for the trigger point to relax. No medication of any kind is injected during the procedure.   . What Does Trigger Point Dry Needling Feel Like?  o The procedure feels different for each individual patient. Some patients report that they do not actually feel the needle enter the skin and overall the process is not painful. Very mild bleeding may occur. However, many patients feel a deep cramping in the muscle in which the needle was inserted. This is the local twitch response.   Marland Kitchen How Will I feel after the treatment? o Soreness is normal, and the onset of soreness may not occur for a few hours. Typically this soreness does not last longer than two days.  o Bruising is uncommon, however; ice can be used to decrease any possible bruising.  o In rare cases feeling tired or nauseous after the treatment is normal. In addition, your symptoms may get worse before they get better, this period will typically not last longer than 24 hours.   . What Can I do After My Treatment? o Increase your hydration by drinking more water for the next 24 hours. o You may place ice or heat on the areas treated that have become sore, however, do not use heat on inflamed or bruised areas. Heat often brings more relief post needling. o You can continue your regular  activities, but vigorous activity is not recommended initially after the treatment for 24 hours. o DN is best combined with other physical therapy such as strengthening, stretching, and other therapies.

## 2020-03-17 ENCOUNTER — Other Ambulatory Visit: Payer: Self-pay

## 2020-03-17 ENCOUNTER — Ambulatory Visit: Payer: BC Managed Care – PPO | Admitting: Physical Therapy

## 2020-03-17 DIAGNOSIS — M546 Pain in thoracic spine: Secondary | ICD-10-CM | POA: Diagnosis not present

## 2020-03-17 DIAGNOSIS — R252 Cramp and spasm: Secondary | ICD-10-CM

## 2020-03-17 DIAGNOSIS — M6281 Muscle weakness (generalized): Secondary | ICD-10-CM

## 2020-03-17 NOTE — Patient Instructions (Signed)
Access Code: [email or web address] URL: [email or web address] Date: 03/17/2020 Prepared by: Ruben Im  Exercises Doorway Rhomboid Stretch - 1 x daily - 7 x weekly - 1 sets - 3 reps - 20 hold Thoracic Extension with Foam Roll - 1 x daily - 7 x weekly - 1 sets - 10 reps Thoracic Extension Mobilization on Foam Roll - 1 x daily - 7 x weekly - 1 sets - 10 reps Sidelying Open Book Thoracic Lumbar Rotation and Extension - 1 x daily - 7 x weekly - 1 sets - 10 reps Seated Levator Scapulae Stretch - 1 x daily - 7 x weekly - 1 sets - 3 reps - 20 hold Standing Shoulder Horizontal Abduction with Resistance - 1 x daily - 7 x weekly - 2 sets - 10 reps Standing Shoulder Diagonal Horizontal Abduction 60/120 Degrees with Resistance - 1 x daily - 7 x weekly - 2 sets - 10 reps Standing Row with Anchored Resistance Band with PLB - 1 x daily - 7 x weekly - 2 sets - 10 reps Shoulder extension with resistance - Neutral - 1 x daily - 7 x weekly - 2 sets - 10 reps

## 2020-03-17 NOTE — Therapy (Signed)
[facility] [address], [place], [place], [address] Phone: [phone]   Fax:  [phone]  Physical Therapy Treatment  Patient Details  Name: Grace Moore MRN: [id number] Date of Birth: Aug 08, 1985 Referring Provider (PT): rhomboid muscle pain   Encounter Date: 03/17/2020   PT End of Session - 03/17/20 1012    Visit Number 2    Date for PT Re-Evaluation 05/05/20    Authorization Type BCBS    PT Start Time 0927    PT Stop Time 1008    PT Time Calculation (min) 41 min    Activity Tolerance Patient tolerated treatment well           Past Medical History:  Diagnosis Date  . GERD (gastroesophageal reflux disease)   . History of kidney stones    passed stone, no surgery    Past Surgical History:  Procedure Laterality Date  . CESAREAN SECTION  11/2009   [place]  . CESAREAN SECTION WITH BILATERAL TUBAL LIGATION Bilateral 08/27/2014   Procedure: REPEAT CESAREAN SECTION WITH BILATERAL TUBAL LIGATION ;  Surgeon: Aloha Gell, MD;  Location: [facility];  Service: Obstetrics;  Laterality: Bilateral;  EDD: 09/03/14  . CHOLECYSTECTOMY    . WISDOM TOOTH EXTRACTION    . WRIST SURGERY     x 2 one left and 1 right     There were no vitals filed for this visit.   Subjective Assessment - 03/17/20 0930    Subjective Some soreness after DN but I liked it.  It's not as painful.    Currently in Pain? Yes    Pain Score 3     Pain Location Neck    Pain Orientation Right    Pain Descriptors / Indicators Aching    Pain Radiating Towards upper trap region                             [facility] Adult PT Treatment/Exercise - 03/17/20 0001      Neck Exercises: Supine   Other Supine Exercise thoracic extension with pool noodle 10x      Neck Exercises: Sidelying   Other Sidelying Exercise open books 5x right/left      Shoulder Exercises: Standing   Extension Strengthening;Both;10 reps    Theraband Level  (Shoulder Extension) Level 3 (Green)    Row Strengthening;Both;10 reps    Theraband Level (Shoulder Row) Level 3 (Green)    Diagonals Both;10 reps;Theraband    Theraband Level (Shoulder Diagonals) Level 3 (Green)      Moist Heat Therapy   Number Minutes Moist Heat 3 Minutes    Moist Heat Location Shoulder;Cervical      Manual Therapy   Soft tissue mobilization upper traps; rhomboids, levator scap      Neck Exercises: Stretches   Levator Stretch Right;3 reps;20 seconds            Trigger Point Dry Needling - 03/17/20 0001    Consent Given? Yes    Education Handout Provided Yes    Muscles Treated Head and Neck Upper trapezius    Other Dry Needling right    Upper Trapezius Response Twitch reponse elicited;Palpable increased muscle length    Levator Scapulae Response Palpable increased muscle length                PT Education - 03/17/20 1008    Education Details green band standing ex's;  open books; levator scap stretch; supine  thoracic extension    Person(s) Educated Patient    Methods Explanation;Demonstration;Handout    Comprehension Returned demonstration;Verbalized understanding            PT Short Term Goals - 03/10/20 1103      PT SHORT TERM GOAL #1   Title The patient will demonstrate basic self care strategies and ex's for pain relief    Time 4    Period Weeks    Status New    Target Date 04/07/20      PT SHORT TERM GOAL #2   Title The patient will report a 30% reduction in right medial scapular pain 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    Time 4    Period Weeks    Status New             PT Long Term Goals - 03/10/20 1104      PT LONG TERM GOAL #1   Title The patient will be independent in safe self progression of HEP    Time 8    Period Weeks    Status New    Target Date 05/05/20      PT LONG TERM GOAL #2   Title The patient will report a 60% reduction in scapular pain with lifting 37 year olds at work    Time 8    Period Weeks     Status New      PT LONG TERM GOAL #3   Title The patient will have improved middle and lower trap muscle strength to 4+/5 needed for lifting    Time 8    Period Weeks    Status New      PT LONG TERM GOAL #4   Title FOTO functional outcome score improved from 55% to 69%    Time 8    Period Weeks    Status New                 Plan - 03/17/20 8295    Clinical Impression Statement The patient reports good relief of right medial scapular pain as the result of DN and manual therapy last session.  Her primary complaint today is right levator and upper trap region pain.  She is receptive to DN and manual therapy to address tender points in these muscles. Much improved soft tissue mobility following treatment.  She was instructed in additional stretching and added scapular muscle strengthening with verbal cues to avoid compensatory shoulder hike.  Therapist monitoring response with all treatment interventions.    Personal Factors and Comorbidities Time since onset of injury/illness/exacerbation;Profession    Examination-Activity Limitations Lift;Sleep;Carry;Reach Overhead    Rehab Potential Good    PT Frequency 1x / week    PT Duration 8 weeks    PT Treatment/Interventions ADLs/Self Care Home Management;Cryotherapy;Electrical Stimulation;Moist Heat;Iontophoresis 4mg /ml Dexamethasone;Ultrasound;Neuromuscular re-education;Therapeutic exercise;Therapeutic activities;Patient/family education;Manual techniques;Dry needling;Taping;Spinal Manipulations    PT Next Visit Plan assess response to DN #2;  review band ex's and start  foam roll ex's (she has one at home)    PT Home Exercise Plan [id number]           Patient will benefit from skilled therapeutic intervention in order to improve the following deficits and impairments:  Increased fascial restricitons,Increased muscle spasms,Impaired UE functional use,Pain,Decreased strength  Visit Diagnosis: Pain in thoracic spine  Cramp and  spasm  Muscle weakness (generalized)     Problem List Patient Active Problem List   Diagnosis Date Noted  . GERD (gastroesophageal reflux  disease) 05/22/2017  . Migraine headache 05/22/2017  . Morbid obesity ([place]) 05/22/2017  . Status post repeat low transverse cesarean section & BTL 08/27/2014   Ruben Im, PT 03/17/20 12:19 PM Phone: [phone] Fax: [phone] [name] 03/17/2020, 12:19 PM   [facility] [address], [place], [place], [address] Phone: [phone]   Fax:  [phone]  Name: IARA MONDS MRN: [id number] Date of Birth: 1986/01/15

## 2020-03-22 ENCOUNTER — Encounter: Payer: Self-pay | Admitting: Family Medicine

## 2020-03-23 ENCOUNTER — Ambulatory Visit: Payer: BC Managed Care – PPO | Admitting: Physical Therapy

## 2020-03-23 ENCOUNTER — Encounter: Payer: Self-pay | Admitting: Physical Therapy

## 2020-03-23 ENCOUNTER — Other Ambulatory Visit: Payer: Self-pay

## 2020-03-23 DIAGNOSIS — R252 Cramp and spasm: Secondary | ICD-10-CM

## 2020-03-23 DIAGNOSIS — M6281 Muscle weakness (generalized): Secondary | ICD-10-CM

## 2020-03-23 DIAGNOSIS — M546 Pain in thoracic spine: Secondary | ICD-10-CM

## 2020-03-23 NOTE — Therapy (Signed)
[facility] [address], [place], [place], [address] Phone: [phone]   Fax:  [phone]  Physical Therapy Treatment  Patient Details  Name: Grace Moore MRN: [id number] Date of Birth: 1985-05-02 Referring Provider (PT): rhomboid muscle pain   Encounter Date: 03/23/2020   PT End of Session - 03/23/20 1400    Visit Number 3    Date for PT Re-Evaluation 05/05/20    Authorization Type BCBS    PT Start Time 1401    PT Stop Time 1441    PT Time Calculation (min) 40 min    Activity Tolerance Patient tolerated treatment well    Behavior During Therapy [facility] for tasks assessed/performed           Past Medical History:  Diagnosis Date  . GERD (gastroesophageal reflux disease)   . History of kidney stones    passed stone, no surgery    Past Surgical History:  Procedure Laterality Date  . CESAREAN SECTION  11/2009   [place]  . CESAREAN SECTION WITH BILATERAL TUBAL LIGATION Bilateral 08/27/2014   Procedure: REPEAT CESAREAN SECTION WITH BILATERAL TUBAL LIGATION ;  Surgeon: Aloha Gell, MD;  Location: [facility];  Service: Obstetrics;  Laterality: Bilateral;  EDD: 09/03/14  . CHOLECYSTECTOMY    . WISDOM TOOTH EXTRACTION    . WRIST SURGERY     x 2 one left and 1 right     There were no vitals filed for this visit.   Subjective Assessment - 03/23/20 1404    Subjective I am still sore after the needling last time but just where    Patient Stated Goals figure how to get pain to stop; don't know what stretches to try    Currently in Pain? Yes    Pain Score 2     Pain Location Neck    Pain Orientation Right    Pain Descriptors / Indicators Aching    Pain Type Chronic pain    Pain Onset More than a month ago    Pain Frequency Intermittent    Multiple Pain Sites No                             [facility] Adult PT Treatment/Exercise - 03/23/20 0001      Self-Care   Self-Care Other Self-Care Comments     Other Self-Care Comments  discussed ways to position pillows to keep chest more open      Neuro Re-ed    Neuro Re-ed Details  cues for posture throughout      Neck Exercises: Standing   Neck Retraction Limitations done while doing band ex's agains the foam roller    Upper Extremity D2 20 reps;Theraband    Theraband Level (UE D2) Level 3 (Green)    Other Standing Exercises horizontal abduction green - foam roller on wall - 20x      Neck Exercises: Supine   Other Supine Exercise foam roller MELT - head nods and cervical rotation      Shoulder Exercises: Standing   External Rotation Strengthening;Both;20 reps;Theraband    Theraband Level (Shoulder External Rotation) Level 3 (Green)    External Rotation Limitations foam roller on wall      Manual Therapy   Manual Therapy Soft tissue mobilization;Joint mobilization    Joint Mobilization ribs 4-6 P/A Rt side; rotation mobs T4-6    Soft tissue mobilization upper traps; rhomboids, levator scap; thoracic paraspinals  Trigger Point Dry Needling - 03/23/20 0001    Consent Given? Yes    Education Handout Provided Previously provided    Muscles Treated Back/Hip Thoracic multifidi    Upper Trapezius Response Twitch reponse elicited;Palpable increased muscle length    Rhomboids Response Palpable increased muscle length    Thoracic multifidi response Twitch response elicited;Palpable increased muscle length   rt side                 PT Short Term Goals - 03/23/20 1525      PT SHORT TERM GOAL #2   Title The patient will report a 30% reduction in right medial scapular pain with lifting 33 year olds at work    Baseline feels better - did not specify percent improvement    Time 4    Period Weeks    Status On-going             PT Long Term Goals - 03/10/20 1104      PT LONG TERM GOAL #1   Title The patient will be independent in safe self progression of HEP    Time 8    Period Weeks    Status New    Target  Date 05/05/20      PT LONG TERM GOAL #2   Title The patient will report a 60% reduction in scapular pain with lifting 71 year olds at work    Time 8    Period Weeks    Status New      PT [place] #3   Title The patient will have improved middle and lower trap muscle strength to 4+/5 needed for lifting    Time 8    Period Weeks    Status New      PT LONG TERM GOAL #4   Title FOTO functional outcome score improved from 55% to 69%    Time 8    Period Weeks    Status New                 Plan - 03/23/20 1521    Clinical Impression Statement Pt was having some soreness from one of the needles last session so that spot of distal levator was avoided.  No increased pain today.  Pt was able to demo band ex's correctly with some posture cues.  Posture was cued with foam roll behind back to emphasize good spinal alignment.  Pt did well with STM and dry needling techniques with reduced tenderness around T4-6 as we added Thoracic multifidi today.  Pt will benefit from skilled PT to continue working on strength and pain management.    PT Treatment/Interventions ADLs/Self Care Home Management;Cryotherapy;Electrical Stimulation;Moist Heat;Iontophoresis 4mg /ml Dexamethasone;Ultrasound;Neuromuscular re-education;Therapeutic exercise;Therapeutic activities;Patient/family education;Manual techniques;Dry needling;Taping;Spinal Manipulations    PT Next Visit Plan assess response to DN #3;  review band ex's and start  foam roll ex's (she has one at home)    PT Home Exercise Plan [id number]    Consulted and Agree with Plan of Care Patient           Patient will benefit from skilled therapeutic intervention in order to improve the following deficits and impairments:  Increased fascial restricitons,Increased muscle spasms,Impaired UE functional use,Pain,Decreased strength  Visit Diagnosis: Pain in thoracic spine  Cramp and spasm  Muscle weakness (generalized)     Problem List Patient  Active Problem List   Diagnosis Date Noted  . GERD (gastroesophageal reflux disease) 05/22/2017  . Migraine headache 05/22/2017  .  Morbid obesity ([place]) 05/22/2017  . Status post repeat low transverse cesarean section & BTL 08/27/2014    Jule Ser, PT 03/23/2020, 3:30 PM   [facility] [address] [place], [place], [address] Phone: [phone]   Fax:  [phone]  Name: MATHILDA MAGUIRE MRN: [id number] Date of Birth: [birth year]

## 2020-03-29 ENCOUNTER — Encounter: Payer: Self-pay | Admitting: Family Medicine

## 2020-03-29 ENCOUNTER — Ambulatory Visit (INDEPENDENT_AMBULATORY_CARE_PROVIDER_SITE_OTHER): Payer: BC Managed Care – PPO | Admitting: Family Medicine

## 2020-03-29 ENCOUNTER — Other Ambulatory Visit: Payer: Self-pay

## 2020-03-29 VITALS — BP 112/58 | HR 99 | Temp 98.3°F | Ht 63.0 in | Wt 235.8 lb

## 2020-03-29 DIAGNOSIS — L299 Pruritus, unspecified: Secondary | ICD-10-CM

## 2020-03-29 MED ORDER — HYDROXYZINE HCL 25 MG PO TABS: 12.5000 mg | ORAL_TABLET | Freq: Three times a day (TID) | ORAL | 2 refills | Status: DC | PRN

## 2020-03-29 NOTE — Patient Instructions (Signed)
Please follow up if symptoms do not improve or as needed.   I will release your lab results to you on your MyChart account with further instructions. Please reply with any questions.    Pruritus Pruritus is an itchy feeling on the skin. One of the most common causes is dry skin, but many different things can cause itching. Most cases of itching do not require medical attention. Sometimes itchy skin can turn into a rash. Follow these instructions at home: Skin care  Apply moisturizing lotion to your skin as needed. Lotion that contains petroleum jelly is best.  Take medicines or apply medicated creams only as told by your health care provider. This may include: ? Corticosteroid cream. ? Anti-itch lotions. ? Oral antihistamines.  Apply a cool, wet cloth (cool compress) to the affected areas.  Take baths with one of the following: ? Epsom salts. You can get these at your local pharmacy or grocery store. Follow the instructions on the packaging. ? Baking soda. Pour a small amount into the bath as told by your health care provider. ? Colloidal oatmeal. You can get this at your local pharmacy or grocery store. Follow the instructions on the packaging.  Apply baking soda paste to your skin. To make the paste, stir water into a small amount of baking soda until it reaches a paste-like consistency.  Do not scratch your skin.  Do not take hot showers or baths, which can make itching worse. A cool shower may help with itching as long as you apply moisturizing lotion after the shower.  Do not use scented soaps, detergents, perfumes, and cosmetic products. Instead, use gentle, unscented versions of these items.   General instructions  Avoid wearing tight clothes.  Keep a journal to help find out what is causing your itching. Write down: ? What you eat and drink. ? What cosmetic products you use. ? What soaps or detergents you use. ? What you wear, including jewelry.  Use a humidifier.  This keeps the air moist, which helps to prevent dry skin.  Be aware of any changes in your itchiness. Contact a health care provider if:  The itching does not go away after several days.  You are unusually thirsty or urinating more than normal.  Your skin tingles or feels numb.  Your skin or the white parts of your eyes turn yellow (jaundice).  You feel weak.  You have any of the following: ? Night sweats. ? Tiredness (fatigue). ? Weight loss. ? Abdominal pain. Summary  Pruritus is an itchy feeling on the skin. One of the most common causes is dry skin, but many different conditions and factors can cause itching.  Apply moisturizing lotion to your skin as needed. Lotion that contains petroleum jelly is best.  Take medicines or apply medicated creams only as told by your health care provider.  Do not take hot showers or baths. Do not use scented soaps, detergents, perfumes, or cosmetic products. This information is not intended to replace advice given to you by your health care provider. Make sure you discuss any questions you have with your health care provider. Document Revised: 03/04/2017 Document Reviewed: 03/04/2017 Elsevier Patient Education  2021 [employer].

## 2020-03-29 NOTE — Progress Notes (Signed)
Subjective  CC:  Chief Complaint  Patient presents with  . Pruritis    Pt complains of itching in the right breast. She says this has been going on for "months" now. She denies any pain, rash, or redness. She has tried Allegra, Benadryl, Zyrtec, and Aquaphor. The itching has not subsided.     HPI: Grace Moore is a 35 y.o. female who presents to the office today to address the problems listed above in the chief complaint.  35 year old female with history of iron deficiency anemia and migraines her reports acute onset of itching on the right nipple about 2 months ago.  Since the entire right breast itches almost all the time.  She has not identified any new irritants, medications.  She has never had a rash.  The itching has remained localized to the right breast.  She is not significantly anxious about what the causes but would like to know if not serious.  At times she awakens due to itching or has noticed that she is scratched in the middle of the night.  She has tried the above-noted medications without any improvement.  She denies associated fevers, chills, sweats, cough, right upper quadrant pain, jaundice, abdominal pain, melena or fatigue.  Assessment  1. Pruritus      Plan   Localized pruritus without rash: Unclear etiology.  Begin evaluation to rule out systemic causes.  Lab work ordered.  Vistaril and topical antihistamine recommended.  Further work-up pending results.  Follow up: Return if symptoms worsen or fail to improve.  Visit date not found  Orders Placed This Encounter  Procedures  . CBC with Differential/Platelet  . Comprehensive metabolic panel  . Sedimentation rate  . TSH  . Antinuclear Antib (ANA)   Meds ordered this encounter  Medications  . hydrOXYzine (ATARAX/VISTARIL) 25 MG tablet    Sig: Take 0.5-1 tablets (12.5-25 mg total) by mouth 3 (three) times daily as needed for itching.    Dispense:  30 tablet    Refill:  2      I reviewed the  patients updated PMH, FH, and SocHx.    Patient Active Problem List   Diagnosis Date Noted  . GERD (gastroesophageal reflux disease) 05/22/2017  . Migraine headache 05/22/2017  . Morbid obesity ([place]) 05/22/2017  . Status post repeat low transverse cesarean section & BTL 08/27/2014   Current Meds  Medication Sig  . esomeprazole (NEXIUM) 40 MG capsule Take 1 capsule (40 mg total) by mouth 2 (two) times daily before a meal.  . hydrOXYzine (ATARAX/VISTARIL) 25 MG tablet Take 0.5-1 tablets (12.5-25 mg total) by mouth 3 (three) times daily as needed for itching.  . zonisamide (ZONEGRAN) 50 MG capsule TK 2 CS PO D    Allergies: Patient has No Known Allergies. Family History: Patient family history includes AAA (abdominal aortic aneurysm) in her father; Arthritis in her maternal grandmother; Asthma in her son; Breast cancer in her maternal grandmother; Diabetes in her maternal grandfather; Healthy in her daughter; Heart attack in her father; Hyperlipidemia in her father; Hypertension in her father and mother; Kidney disease in her maternal grandmother; Stroke in her father. Social History:  Patient  reports that she has never smoked. She has never used smokeless tobacco. She reports that she does not drink alcohol and does not use drugs.  Review of Systems: Constitutional: Negative for fever malaise or anorexia Cardiovascular: negative for chest pain Respiratory: negative for SOB or persistent cough Gastrointestinal: negative for abdominal pain  Objective  Vitals: BP (!) 112/58   Pulse 99   Temp 98.3 F (36.8 C) (Temporal)   Ht 5\' 3"  (1.6 m)   Wt 235 lb 12.8 oz (107 kg)   SpO2 96%   BMI 41.77 kg/m  General: no acute distress , A&Ox3 Right breast skin: No rash.  No excoriation. Bilateral breast exam: No masses palpated, no nipple discharge.     Commons side effects, risks, benefits, and alternatives for medications and treatment plan prescribed today were discussed, and the  patient expressed understanding of the given instructions. Patient is instructed to call or message via MyChart if he/she has any questions or concerns regarding our treatment plan. No barriers to understanding were identified. We discussed Red Flag symptoms and signs in detail. Patient expressed understanding regarding what to do in case of urgent or emergency type symptoms.   Medication list was reconciled, printed and provided to the patient in AVS. Patient instructions and summary information was reviewed with the patient as documented in the AVS. This note was prepared with assistance of Dragon voice recognition software. Occasional wrong-word or sound-a-like substitutions may have occurred due to the inherent limitations of voice recognition software  This visit occurred during the SARS-CoV-2 public health emergency.  Safety protocols were in place, including screening questions prior to the visit, additional usage of staff PPE, and extensive cleaning of exam room while observing appropriate contact time as indicated for disinfecting solutions.

## 2020-03-30 LAB — COMPREHENSIVE METABOLIC PANEL
ALT: 16 U/L (ref 0–35)
AST: 15 U/L (ref 0–37)
Albumin: 4 g/dL (ref 3.5–5.2)
Alkaline Phosphatase: 62 U/L (ref 39–117)
BUN: 13 mg/dL (ref 6–23)
CO2: 27 mEq/L (ref 19–32)
Calcium: 9.1 mg/dL (ref 8.4–10.5)
Chloride: 106 mEq/L (ref 96–112)
Creatinine, Ser: 0.84 mg/dL (ref 0.40–1.20)
GFR: 91.66 mL/min (ref >60.00)
Glucose, Bld: 86 mg/dL (ref 70–99)
Potassium: 3.8 mEq/L (ref 3.5–5.1)
Sodium: 139 mEq/L (ref 135–145)
Total Bilirubin: 0.4 mg/dL (ref 0.2–1.2)
Total Protein: 7 g/dL (ref 6.0–8.3)

## 2020-03-30 LAB — TSH: TSH: 2.36 u[IU]/mL (ref 0.35–4.50)

## 2020-03-30 LAB — CBC WITH DIFFERENTIAL/PLATELET
Basophils Absolute: 0.1 10*3/uL (ref 0.0–0.1)
Basophils Relative: 0.6 % (ref 0.0–3.0)
Eosinophils Absolute: 0.1 10*3/uL (ref 0.0–0.7)
Eosinophils Relative: 0.7 % (ref 0.0–5.0)
HCT: 38.8 % (ref 36.0–46.0)
Hemoglobin: 12.8 g/dL (ref 12.0–15.0)
Lymphocytes Relative: 23.7 % (ref 12.0–46.0)
Lymphs Abs: 2.6 10*3/uL (ref 0.7–4.0)
MCHC: 33 g/dL (ref 30.0–36.0)
MCV: 85.2 fl (ref 78.0–100.0)
Monocytes Absolute: 0.7 10*3/uL (ref 0.1–1.0)
Monocytes Relative: 6.3 % (ref 3.0–12.0)
Neutro Abs: 7.4 10*3/uL (ref 1.4–7.7)
Neutrophils Relative %: 68.7 % (ref 43.0–77.0)
Platelets: 343 10*3/uL (ref 150.0–400.0)
RBC: 4.55 Mil/uL (ref 3.87–5.11)
RDW: 17.1 % — ABNORMAL HIGH (ref 11.5–15.5)
WBC: 10.8 10*3/uL — ABNORMAL HIGH (ref 4.0–10.5)

## 2020-03-30 LAB — ANA: Anti Nuclear Antibody (ANA): NEGATIVE

## 2020-03-30 LAB — SEDIMENTATION RATE: Sed Rate: 25 mm/hr — ABNORMAL HIGH (ref 0–20)

## 2020-04-04 ENCOUNTER — Encounter: Payer: Self-pay | Admitting: Family Medicine

## 2020-04-07 ENCOUNTER — Other Ambulatory Visit: Payer: Self-pay

## 2020-04-07 ENCOUNTER — Encounter: Payer: Self-pay | Admitting: Physical Therapy

## 2020-04-07 ENCOUNTER — Ambulatory Visit: Payer: BC Managed Care – PPO | Attending: Family Medicine | Admitting: Physical Therapy

## 2020-04-07 DIAGNOSIS — M6281 Muscle weakness (generalized): Secondary | ICD-10-CM | POA: Diagnosis present

## 2020-04-07 DIAGNOSIS — M546 Pain in thoracic spine: Secondary | ICD-10-CM | POA: Insufficient documentation

## 2020-04-07 DIAGNOSIS — R252 Cramp and spasm: Secondary | ICD-10-CM | POA: Insufficient documentation

## 2020-04-07 NOTE — Therapy (Signed)
[facility] [address], [place], [place], [address] Phone: [phone]   Fax:  [phone]  Physical Therapy Treatment  Patient Details  Name: Grace Moore MRN: [id number] Date of Birth: 26-Sep-1985 Referring Provider (PT): rhomboid muscle pain   Encounter Date: 04/07/2020   PT End of Session - 04/07/20 0922    Visit Number 4    Date for PT Re-Evaluation 05/05/20    Authorization Type BCBS    PT Start Time 0841    PT Stop Time 0925    PT Time Calculation (min) 44 min    Activity Tolerance Patient tolerated treatment well           Past Medical History:  Diagnosis Date  . GERD (gastroesophageal reflux disease)   . History of kidney stones    passed stone, no surgery    Past Surgical History:  Procedure Laterality Date  . CESAREAN SECTION  11/2009   [place]  . CESAREAN SECTION WITH BILATERAL TUBAL LIGATION Bilateral 08/27/2014   Procedure: REPEAT CESAREAN SECTION WITH BILATERAL TUBAL LIGATION ;  Surgeon: Aloha Gell, MD;  Location: [facility];  Service: Obstetrics;  Laterality: Bilateral;  EDD: 09/03/14  . CHOLECYSTECTOMY    . WISDOM TOOTH EXTRACTION    . WRIST SURGERY     x 2 one left and 1 right     There were no vitals filed for this visit.   Subjective Assessment - 04/07/20 0843    Subjective Feeling pretty good.  I have one spot that's still there but not as bad.  Ex's going OK. I use my foam roll at home.    Currently in Pain? No/denies    Pain Score 0-No pain    Pain Location Thoracic    Pain Type Chronic pain                             [facility] Adult PT Treatment/Exercise - 04/07/20 0001      Neck Exercises: Standing   Neck Retraction Limitations hip hinge forward with 5# kettlebell row 10x right/left    Wall Push Ups Limitations counter top push ups 10x    Other Standing Exercises 15# dead lifts 15x    Other Standing Exercises 5# snatch and press overhead 10x right/left       Shoulder Exercises: ROM/Strengthening   UBE (Upper Arm Bike) 4 min forward and back    Other ROM/Strengthening Exercises foam roll thread the needle 7x right/left    Other ROM/Strengthening Exercises foam roll childs pose      Manual Therapy   Soft tissue mobilization bil rhomboids and thoracic paraspinals            Trigger Point Dry Needling - 04/07/20 0001    Other Dry Needling right    Rhomboids Response Palpable increased muscle length    Thoracic multifidi response Palpable increased muscle length   right paraspinal fanning                 PT Short Term Goals - 04/07/20 0926      PT SHORT TERM GOAL #1   Title The patient will demonstrate basic self care strategies and ex's for pain relief    Status Achieved      PT SHORT TERM GOAL #2   Title The patient will report a 30% reduction in right medial scapular pain with lifting 73 year olds at work    Time  4    Status Achieved             PT Long Term Goals - 03/10/20 1104      PT LONG TERM GOAL #1   Title The patient will be independent in safe self progression of HEP    Time 8    Period Weeks    Status New    Target Date 05/05/20      PT LONG TERM GOAL #2   Title The patient will report a 60% reduction in scapular pain with lifting 31 year olds at work    Time 8    Period Weeks    Status New      PT LONG TERM GOAL #3   Title The patient will have improved middle and lower trap muscle strength to 4+/5 needed for lifting    Time 8    Period Weeks    Status New      PT LONG TERM GOAL #4   Title FOTO functional outcome score improved from 55% to 69%    Time 8    Period Weeks    Status New                 Plan - 04/07/20 1324    Clinical Impression Statement The patient is able to progress with thoracic and periscapular strengthening with 5# and 15# kettlebells.  Min cues for hip hinge technique and cues to activate scapular retractors.   She reports only mild discomfort in right medial  scapular region with left single arm strengthening.  She has a positive response to DN and manual therapy with decreased tender points and soft tissue mobility following treatment.  On track with rehab goals.    Rehab Potential Good    PT Frequency 1x / week    PT Duration 8 weeks    PT Treatment/Interventions ADLs/Self Care Home Management;Cryotherapy;Electrical Stimulation;Moist Heat;Iontophoresis 4mg /ml Dexamethasone;Ultrasound;Neuromuscular re-education;Therapeutic exercise;Therapeutic activities;Patient/family education;Manual techniques;Dry needling;Taping;Spinal Manipulations    PT Next Visit Plan assess response to DN #3;  review kettlebell strengthening; thread the needle variations; counter push ups    PT Home Exercise Plan [email or web address]           Patient will benefit from skilled therapeutic intervention in order to improve the following deficits and impairments:  Increased fascial restricitons,Increased muscle spasms,Impaired UE functional use,Pain,Decreased strength  Visit Diagnosis: Pain in thoracic spine  Cramp and spasm  Muscle weakness (generalized)     Problem List Patient Active Problem List   Diagnosis Date Noted  . GERD (gastroesophageal reflux disease) 05/22/2017  . Migraine headache 05/22/2017  . Morbid obesity ([place]) 05/22/2017  . Status post repeat low transverse cesarean section & BTL 08/27/2014   Ruben Im, PT 04/07/20 9:29 AM Phone: [phone] Fax: [phone] [name] 04/07/2020, 9:28 AM  [facility] [address] [place], [place], [address] Phone: [phone]   Fax:  [phone]  Name: Grace Moore MRN: [id number] Date of Birth: 21-Aug-1985

## 2020-04-14 ENCOUNTER — Encounter: Payer: Self-pay | Admitting: Physical Therapy

## 2020-04-14 ENCOUNTER — Ambulatory Visit: Payer: BC Managed Care – PPO | Admitting: Physical Therapy

## 2020-04-14 ENCOUNTER — Other Ambulatory Visit: Payer: Self-pay

## 2020-04-14 DIAGNOSIS — M546 Pain in thoracic spine: Secondary | ICD-10-CM | POA: Diagnosis not present

## 2020-04-14 DIAGNOSIS — M6281 Muscle weakness (generalized): Secondary | ICD-10-CM

## 2020-04-14 DIAGNOSIS — R252 Cramp and spasm: Secondary | ICD-10-CM

## 2020-04-14 NOTE — Therapy (Signed)
[facility] [address] [place], [place], [address] Phone: [phone]   Fax:  [phone]  Physical Therapy Treatment  Patient Details  Name: Grace Moore MRN: [id number] Date of Birth: 11/30/1985 Referring Provider (PT): rhomboid muscle pain   Encounter Date: 04/14/2020   PT End of Session - 04/14/20 0844    Visit Number 5    Date for PT Re-Evaluation 05/05/20    Authorization Type BCBS    PT Start Time 0800    PT Stop Time 0842    PT Time Calculation (min) 42 min    Activity Tolerance Patient tolerated treatment well           Past Medical History:  Diagnosis Date  . GERD (gastroesophageal reflux disease)   . History of kidney stones    passed stone, no surgery    Past Surgical History:  Procedure Laterality Date  . CESAREAN SECTION  11/2009   [place]  . CESAREAN SECTION WITH BILATERAL TUBAL LIGATION Bilateral 08/27/2014   Procedure: REPEAT CESAREAN SECTION WITH BILATERAL TUBAL LIGATION ;  Surgeon: Aloha Gell, MD;  Location: [place];  Service: Obstetrics;  Laterality: Bilateral;  EDD: 09/03/14  . CHOLECYSTECTOMY    . WISDOM TOOTH EXTRACTION    . WRIST SURGERY     x 2 one left and 1 right     There were no vitals filed for this visit.   Subjective Assessment - 04/14/20 0801    Subjective It's been an interesting week at work.  That spot feels pretty good this week.    Patient Stated Goals figure how to get pain to stop; don't know what stretches to try    Currently in Pain? No/denies    Pain Score 0-No pain    Pain Location Thoracic                             [facility] Adult PT Treatment/Exercise - 04/14/20 0001      Therapeutic Activites    Therapeutic Activities Lifting    Lifting 10# and 15# weight carry 6 laps total      Neck Exercises: Standing   Wall Push Ups Limitations tabletop push ups and shoulder taps 10x each    Other Standing Exercises 15# dead lifts 15x     Other Standing Exercises 5# snatch and press overhead 10x right/left      Shoulder Exercises: Standing   Shoulder Elevation Limitations 5# snatch and press overhead    Other Standing Exercises 5# squats 10x    Other Standing Exercises overhead triceps 4# 15x      Shoulder Exercises: ROM/Strengthening   UBE (Upper Arm Bike) 4 min forward and back    Other ROM/Strengthening Exercises bent rows 5# 10x right/left    Other ROM/Strengthening Exercises light green loop on wall: flexion slides, wall clocks, scoops up and down wall 5-10 reps each      Shoulder Exercises: Power Tower   Extension 10 reps    Extension Limitations 20#    Row 15 reps    Row Limitations vertical and horizontal 15#                    PT Short Term Goals - 04/07/20 0926      PT SHORT TERM GOAL #1   Title The patient will demonstrate basic self care strategies and ex's for pain relief    Status Achieved  PT SHORT TERM GOAL #2   Title The patient will report a 30% reduction in right medial scapular pain with lifting 102 year olds at work    Time 4    Status Achieved             PT Long Term Goals - 03/10/20 1104      PT LONG TERM GOAL #1   Title The patient will be independent in safe self progression of HEP    Time 8    Period Weeks    Status New    Target Date 05/05/20      PT LONG TERM GOAL #2   Title The patient will report a 60% reduction in scapular pain with lifting 31 year olds at work    Time 8    Period Weeks    Status New      PT LONG TERM GOAL #3   Title The patient will have improved middle and lower trap muscle strength to 4+/5 needed for lifting    Time 8    Period Weeks    Status New      PT LONG TERM GOAL #4   Title FOTO functional outcome score improved from 55% to 69%    Time 8    Period [place] - 04/14/20 0844    Clinical Impression Statement The patient is able to participate in progressive periscapular and spinal  muscle strengthening today without production of pain.  She has appropriate level of muscle fatigue given intensity of exercise.  Improving activation of middle and lower traps.  DN and manual therapy not needed today and encouraged regular continuation of HEP for best long term prognosis and outcomes.    Personal Factors and Comorbidities Time since onset of injury/illness/exacerbation;Profession    Examination-Activity Limitations Lift;Sleep;Carry;Reach Overhead    Examination-Participation Restrictions Occupation;Yard Work    Rehab Potential Good    PT Frequency 1x / week    PT Duration 8 weeks    PT Treatment/Interventions ADLs/Self Care Home Management;Cryotherapy;Electrical Stimulation;Moist Heat;Iontophoresis 4mg /ml Dexamethasone;Ultrasound;Neuromuscular re-education;Therapeutic exercise;Therapeutic activities;Patient/family education;Manual techniques;Dry needling;Taping;Spinal Manipulations    PT Next Visit Plan review kettlebell strengthening; thread the needle variations; counter push ups;  DN as needed    PT Home Exercise Plan [email or web address]           Patient will benefit from skilled therapeutic intervention in order to improve the following deficits and impairments:  Increased fascial restricitons,Increased muscle spasms,Impaired UE functional use,Pain,Decreased strength  Visit Diagnosis: Pain in thoracic spine  Cramp and spasm  Muscle weakness (generalized)     Problem List Patient Active Problem List   Diagnosis Date Noted  . GERD (gastroesophageal reflux disease) 05/22/2017  . Migraine headache 05/22/2017  . Morbid obesity ([place]) 05/22/2017  . Status post repeat low transverse cesarean section & BTL 08/27/2014   Ruben Im, PT 04/14/20 10:23 AM Phone: [phone] Fax: [phone] [name] 04/14/2020, 10:22 AM  [facility] [address] [place], [place], [address] Phone: [phone]   Fax:   [phone]  Name: Grace Moore MRN: [id number] Date of Birth: 1985-10-10

## 2020-04-21 ENCOUNTER — Ambulatory Visit: Payer: BC Managed Care – PPO | Admitting: Physical Therapy

## 2020-04-21 ENCOUNTER — Other Ambulatory Visit: Payer: Self-pay

## 2020-04-21 DIAGNOSIS — M6281 Muscle weakness (generalized): Secondary | ICD-10-CM

## 2020-04-21 DIAGNOSIS — M546 Pain in thoracic spine: Secondary | ICD-10-CM

## 2020-04-21 DIAGNOSIS — R252 Cramp and spasm: Secondary | ICD-10-CM

## 2020-04-21 NOTE — Therapy (Signed)
[facility] [address], [place], [place], [address] Phone: [phone]   Fax:  [phone]  Physical Therapy Treatment/Discharge Summary   Patient Details  Name: TALAYLA DOYEL MRN: [id number] Date of Birth: 12-09-1985 Referring Provider (PT): rhomboid muscle pain   Encounter Date: 04/21/2020   PT End of Session - 04/21/20 0841    Visit Number 6    Date for PT Re-Evaluation 05/05/20    Authorization Type BCBS    PT Start Time 0800    PT Stop Time 0830   discharge visit   PT Time Calculation (min) 30 min    Activity Tolerance Patient tolerated treatment well           Past Medical History:  Diagnosis Date  . GERD (gastroesophageal reflux disease)   . History of kidney stones    passed stone, no surgery    Past Surgical History:  Procedure Laterality Date  . CESAREAN SECTION  11/2009   [place]  . CESAREAN SECTION WITH BILATERAL TUBAL LIGATION Bilateral 08/27/2014   Procedure: REPEAT CESAREAN SECTION WITH BILATERAL TUBAL LIGATION ;  Surgeon: Aloha Gell, MD;  Location: [facility];  Service: Obstetrics;  Laterality: Bilateral;  EDD: 09/03/14  . CHOLECYSTECTOMY    . WISDOM TOOTH EXTRACTION    . WRIST SURGERY     x 2 one left and 1 right     There were no vitals filed for this visit.   Subjective Assessment - 04/21/20 0802    Subjective I feel good.  States she didn't miss the DN.  Rates overall progress at 80%.    Patient Stated Goals figure how to get pain to stop; don't know what stretches to try    Currently in Pain? No/denies    Pain Score 0-No pain    Pain Location Thoracic    Pain Type Chronic pain              [facility] PT Assessment - 04/21/20 0001      Observation/Other Assessments   Focus on Therapeutic Outcomes (FOTO)  79%      AROM   Overall AROM Comments WNLs and painfree with all shoulder ROM    Cervical Flexion 55    Cervical Extension 65    Cervical - Right Side Bend 53    Cervical  - Left Side Bend 50    Cervical - Right Rotation 60    Cervical - Left Rotation 65      Strength   Overall Strength Comments middle and lower traps 4+/5                         [facility] Adult PT Treatment/Exercise - 04/21/20 0001      Shoulder Exercises: ROM/Strengthening   UBE (Upper Arm Bike) 4 min forward and back    Other ROM/Strengthening Exercises comprehensive review of HEP including stretches    Other ROM/Strengthening Exercises added kettlebell strengthening ex's to HEP and discussed importance of strength training with weights, bands and body weight resistance                  PT Education - 04/21/20 0841    Education Details kettlebell ex    Person(s) Educated Patient    Methods Explanation;Handout    Comprehension Verbalized understanding            PT Short Term Goals - 04/21/20 0847      PT SHORT TERM GOAL #1  Title The patient will demonstrate basic self care strategies and ex's for pain relief    Status Achieved      PT SHORT TERM GOAL #2   Title The patient will report a 30% reduction in right medial scapular pain with lifting 61 year olds at work    Status Achieved             PT Long Term Goals - 04/21/20 0847      PT LONG TERM GOAL #1   Title The patient will be independent in safe self progression of HEP    Status Achieved      PT LONG TERM GOAL #2   Title The patient will report a 60% reduction in scapular pain with lifting 66 year olds at work    Status Achieved      PT [place] #3   Title The patient will have improved middle and lower trap muscle strength to 4+/5 needed for lifting    Status Achieved      PT LONG TERM GOAL #4   Title FOTO functional outcome score improved from 55% to 69%    Status Achieved                 Plan - 04/21/20 0819    Clinical Impression Statement The patient rates her overall improvement at 80%.  Her FOTO functional outcome goal is significanly improved from  55% on  initial assessment to 79% today.   Her cervical, shoulder and thoracic ROM is all WNLs and painfree.  Periscapular muscle strength 4+/5.  She would benefit from a continued strengthening program and she is interested in getting a kettlebell for home as she has used in the clinic.  She was provided with kettlebell ex instructions through her [place] ex program.  She has met all rehab goals and expresses readiness for discharge from PT at this time.    Personal Factors and Comorbidities Time since onset of injury/illness/exacerbation;Profession    Examination-Activity Limitations Lift;Sleep;Carry;Reach Overhead    Examination-Participation Restrictions Occupation;Yard Work    Rehab Potential Good    PT Frequency 1x / week    PT Duration 8 weeks    PT Treatment/Interventions ADLs/Self Care Home Management;Cryotherapy;Electrical Stimulation;Moist Heat;Iontophoresis 59m/ml Dexamethasone;Ultrasound;Neuromuscular re-education;Therapeutic exercise;Therapeutic activities;Patient/family education;Manual techniques;Dry needling;Taping;Spinal Manipulations    PT Home Exercise Plan [email or web address]           Patient will benefit from skilled therapeutic intervention in order to improve the following deficits and impairments:  Increased fascial restricitons,Increased muscle spasms,Impaired UE functional use,Pain,Decreased strength  Visit Diagnosis: Pain in thoracic spine  Cramp and spasm  Muscle weakness (generalized)  PHYSICAL THERAPY DISCHARGE SUMMARY  Visits from Start of Care: 6  Current functional level related to goals / functional outcomes: See clinical impressions above  Remaining deficits: As above   Education / Equipment: HEP  Plan: Patient agrees to discharge.  Patient goals were met. Patient is being discharged due to meeting the stated rehab goals.  ?????       Problem List Patient Active Problem List   Diagnosis Date Noted  . GERD (gastroesophageal reflux disease) 05/22/2017  .  Migraine headache 05/22/2017  . Morbid obesity ([place] 05/22/2017  . Status post repeat low transverse cesarean section & BTL 08/27/2014   SRuben Im PT 04/21/20 8:49 AM Phone: [phone]: [phone] Singh2/18/2022, 8:48 AM  [facility] [address] [place] [address]: [phone]  Fax:  [phone]  Name: GRISELA MESCH MRN: [id number] Date of Birth: 07/14/1985

## 2020-04-21 NOTE — Patient Instructions (Signed)
Access Code: [email or web address] URL: [email or web address] Date: 04/21/2020 Prepared by: Ruben Im  Exercises Doorway Rhomboid Stretch - 1 x daily - 7 x weekly - 1 sets - 3 reps - 20 hold Thoracic Extension with Foam Roll - 1 x daily - 7 x weekly - 1 sets - 10 reps Thoracic Extension Mobilization on Foam Roll - 1 x daily - 7 x weekly - 1 sets - 10 reps Sidelying Open Book Thoracic Lumbar Rotation and Extension - 1 x daily - 7 x weekly - 1 sets - 10 reps Seated Levator Scapulae Stretch - 1 x daily - 7 x weekly - 1 sets - 3 reps - 20 hold Standing Shoulder Horizontal Abduction with Resistance - 1 x daily - 7 x weekly - 2 sets - 10 reps Standing Shoulder Diagonal Horizontal Abduction 60/120 Degrees with Resistance - 1 x daily - 7 x weekly - 2 sets - 10 reps Standing Row with Anchored Resistance Band with PLB - 1 x daily - 7 x weekly - 2 sets - 10 reps Shoulder extension with resistance - Neutral - 1 x daily - 7 x weekly - 2 sets - 10 reps Standing Shoulder External Rotation with Resistance - 1 x daily - 7 x weekly - 3 sets - 10 reps Kettlebell Deadlift - 1 x daily - 7 x weekly - 1 sets - 10 reps Goblet Squat with Kettlebell - 1 x daily - 7 x weekly - 1 sets - 10 reps Kettlebell Swing - 1 x daily - 7 x weekly - 1 sets - 10 reps Forward Reach with Kettlebell - 1 x daily - 7 x weekly - 1 sets - 10 reps Waiter's Walk with Kettlebell - 1 x daily - 7 x weekly - 3 sets - 10 reps

## 2020-04-28 ENCOUNTER — Encounter: Payer: BC Managed Care – PPO | Admitting: Physical Therapy

## 2020-04-28 ENCOUNTER — Ambulatory Visit: Payer: BC Managed Care – PPO | Admitting: Family Medicine

## 2020-05-05 ENCOUNTER — Encounter: Payer: BC Managed Care – PPO | Admitting: Physical Therapy

## 2020-05-12 ENCOUNTER — Encounter: Payer: BC Managed Care – PPO | Admitting: Physical Therapy

## 2020-05-31 ENCOUNTER — Telehealth (INDEPENDENT_AMBULATORY_CARE_PROVIDER_SITE_OTHER): Payer: BC Managed Care – PPO | Admitting: Family Medicine

## 2020-05-31 ENCOUNTER — Encounter: Payer: Self-pay | Admitting: Family Medicine

## 2020-05-31 DIAGNOSIS — J069 Acute upper respiratory infection, unspecified: Secondary | ICD-10-CM

## 2020-05-31 MED ORDER — GUAIFENESIN-CODEINE 100-10 MG/5ML PO SOLN: 5.0000 mL | Freq: Four times a day (QID) | ORAL | 0 refills | Status: DC | PRN

## 2020-05-31 NOTE — Progress Notes (Signed)
Virtual Visit via Video Note  Subjective  CC:  Chief Complaint  Patient presents with  . Cough    Productive cough started 05/26/20, negative COVID test     I connected with Ned Grace on 05/31/20 at  4:15 PM EDT by a video enabled telemedicine application and verified that I am speaking with the correct person using two identifiers. Location patient: Home Location provider: [facility] [place], Office Persons participating in the virtual visit: Grace Moore, Leamon Arnt, MD Reymundo Poll CMA  I discussed the limitations of evaluation and management by telemedicine and the availability of in person appointments. The patient expressed understanding and agreed to proceed. HPI: Grace Moore is a 35 y.o. female who was contacted today to address the problems listed above in the chief complaint. . Mild URI symptoms for last 4 to 5 days.  Mainly dry hacking cough.  Worse at night, negatively affecting sleep.  Does okay during the day.  No significant fevers, chills, shortness of breath including chest pain.  No nausea or vomiting.  Had negative Covid testing done.  She is immunized.  Would like help with cough medicines. Assessment  1. Viral URI with cough      Plan   Viral URI: Supportive care discussed.  Robitussin-AC for nighttime use.  Delsym or Mucinex DM for daytime cough.  Reassured.  No red flag symptoms identified I discussed the assessment and treatment plan with the patient. The patient was provided an opportunity to ask questions and all were answered. The patient agreed with the plan and demonstrated an understanding of the instructions.   The patient was advised to call back or seek an in-person evaluation if the symptoms worsen or if the condition fails to improve as anticipated. Follow up: As needed Visit date not found  Meds ordered this encounter  Medications  . guaiFENesin-codeine 100-10 MG/5ML syrup    Sig: Take 5 mLs  by mouth every 6 (six) hours as needed for cough.    Dispense:  120 mL    Refill:  0      I reviewed the patients updated PMH, FH, and SocHx.    Patient Active Problem List   Diagnosis Date Noted  . GERD (gastroesophageal reflux disease) 05/22/2017  . Migraine headache 05/22/2017  . Morbid obesity ([place]) 05/22/2017  . Status post repeat low transverse cesarean section & BTL 08/27/2014   Current Meds  Medication Sig  . esomeprazole (NEXIUM) 40 MG capsule Take 1 capsule (40 mg total) by mouth 2 (two) times daily before a meal.  . guaiFENesin-codeine 100-10 MG/5ML syrup Take 5 mLs by mouth every 6 (six) hours as needed for cough.  . zonisamide (ZONEGRAN) 50 MG capsule TK 2 CS PO D    Allergies: Patient has No Known Allergies. Family History: Patient family history includes AAA (abdominal aortic aneurysm) in her father; Arthritis in her maternal grandmother; Asthma in her son; Breast cancer in her maternal grandmother; Diabetes in her maternal grandfather; Healthy in her daughter; Heart attack in her father; Hyperlipidemia in her father; Hypertension in her father and mother; Kidney disease in her maternal grandmother; Stroke in her father. Social History:  Patient  reports that she has never smoked. She has never used smokeless tobacco. She reports that she does not drink alcohol and does not use drugs.  Review of Systems: Constitutional: Negative for fever malaise or anorexia Cardiovascular: negative for chest pain Respiratory: negative for SOB  or persistent cough Gastrointestinal: negative for abdominal pain  OBJECTIVE Vitals: There were no vitals taken for this visit. General: no acute distress , A&Ox3, appears well  Leamon Arnt, MD

## 2020-07-28 ENCOUNTER — Ambulatory Visit (INDEPENDENT_AMBULATORY_CARE_PROVIDER_SITE_OTHER): Payer: BC Managed Care – PPO | Admitting: Family Medicine

## 2020-07-28 ENCOUNTER — Other Ambulatory Visit: Payer: Self-pay

## 2020-07-28 ENCOUNTER — Encounter: Payer: Self-pay | Admitting: Family Medicine

## 2020-07-28 VITALS — BP 128/80 | HR 72 | Temp 98.3°F | Ht 63.0 in | Wt 237.6 lb

## 2020-07-28 DIAGNOSIS — L723 Sebaceous cyst: Secondary | ICD-10-CM | POA: Diagnosis not present

## 2020-07-28 MED ORDER — SULFAMETHOXAZOLE-TRIMETHOPRIM 800-160 MG PO TABS: 1.0000 | ORAL_TABLET | Freq: Two times a day (BID) | ORAL | 0 refills | Status: DC

## 2020-07-28 NOTE — Progress Notes (Signed)
Subjective  CC:  Chief Complaint  Patient presents with  . Cyst    In middle of head, has been seen for it before. Has started giving her pain, ongoing for about 4 weeks. Painful to the touch    HPI: Grace Moore is a 35 y.o. female who presents to the office today to address the problems listed above in the chief complaint.  Small sebacous cyst of scalp: sore for last several week. Non-draining. Not improving.   Assessment  1. Sebaceous cyst      Plan   Infected sebacous cyst:  Mild infection. No abscess. Trial of abx. F/u if not improved. Consider surg referral for excision   Follow up: as scheduled.   01/12/2021  No orders of the defined types were placed in this encounter.  Meds ordered this encounter  Medications  . sulfamethoxazole-trimethoprim (BACTRIM DS) 800-160 MG tablet    Sig: Take 1 tablet by mouth 2 (two) times daily.    Dispense:  14 tablet    Refill:  0      I reviewed the patients updated PMH, FH, and SocHx.    Patient Active Problem List   Diagnosis Date Noted  . GERD (gastroesophageal reflux disease) 05/22/2017  . Migraine headache 05/22/2017  . Morbid obesity ([place]) 05/22/2017  . Status post repeat low transverse cesarean section & BTL 08/27/2014   Current Meds  Medication Sig  . esomeprazole (NEXIUM) 40 MG capsule Take 1 capsule (40 mg total) by mouth 2 (two) times daily before a meal.  . Ferrous Sulfate (IRON PO) Take by mouth.  . sulfamethoxazole-trimethoprim (BACTRIM DS) 800-160 MG tablet Take 1 tablet by mouth 2 (two) times daily.  Marland Kitchen zonisamide (ZONEGRAN) 50 MG capsule TK 2 CS PO D    Allergies: Patient has No Known Allergies. Family History: Patient family history includes AAA (abdominal aortic aneurysm) in her father; Arthritis in her maternal grandmother; Asthma in her son; Breast cancer in her maternal grandmother; Diabetes in her maternal grandfather; Healthy in her daughter; Heart attack in her father; Hyperlipidemia in her  father; Hypertension in her father and mother; Kidney disease in her maternal grandmother; Stroke in her father. Social History:  Patient  reports that she has never smoked. She has never used smokeless tobacco. She reports that she does not drink alcohol and does not use drugs.  Review of Systems: Constitutional: Negative for fever malaise or anorexia Cardiovascular: negative for chest pain Respiratory: negative for SOB or persistent cough Gastrointestinal: negative for abdominal pain  Objective  Vitals: BP 128/80   Pulse 72   Temp 98.3 F (36.8 C) (Temporal)   Ht 5\' 3"  (1.6 m)   Wt 237 lb 9.6 oz (107.8 kg)   LMP 07/21/2020 (Approximate)   SpO2 97%   BMI 42.09 kg/m  General: no acute distress , A&Ox3 Top of scalp: dimed size mildly tender sebaceous cyst/ no fluctuance. No redness     Commons side effects, risks, benefits, and alternatives for medications and treatment plan prescribed today were discussed, and the patient expressed understanding of the given instructions. Patient is instructed to call or message via MyChart if he/she has any questions or concerns regarding our treatment plan. No barriers to understanding were identified. We discussed Red Flag symptoms and signs in detail. Patient expressed understanding regarding what to do in case of urgent or emergency type symptoms.   Medication list was reconciled, printed and provided to the patient in AVS. Patient instructions and summary information was  reviewed with the patient as documented in the AVS. This note was prepared with assistance of Dragon voice recognition software. Occasional wrong-word or sound-a-like substitutions may have occurred due to the inherent limitations of voice recognition software  This visit occurred during the SARS-CoV-2 public health emergency.  Safety protocols were in place, including screening questions prior to the visit, additional usage of staff PPE, and extensive cleaning of exam room  while observing appropriate contact time as indicated for disinfecting solutions.

## 2020-07-28 NOTE — Patient Instructions (Signed)
Please follow up if symptoms do not improve or as needed.    Epidermoid Cyst  An epidermoid cyst, also known as epidermal cyst, is a sac made of skin tissue. The sac contains a substance called keratin. Keratin is a protein that is normally secreted through the hair follicles. When keratin becomes trapped in the top layer of skin (epidermis), it can form an epidermoid cyst. Epidermoid cysts can be found anywhere on your body. These cysts are usually harmless (benign), and they may not cause symptoms unless they become inflamed or infected. What are the causes? This condition may be caused by:  A blocked hair follicle.  A hair that curls and re-enters the skin instead of growing straight out of the skin (ingrown hair).  A blocked pore.  Irritated skin.  An injury to the skin.  Certain conditions that are passed along from parent to child (inherited).  Human papillomavirus (HPV). This happens rarely when cysts occur on the bottom of the feet.  Long-term (chronic) sun damage to the skin. What increases the risk? The following factors may make you more likely to develop an epidermoid cyst:  Having acne.  Being female.  Having an injury to the skin.  Being past puberty.  Having certain rare genetic disorders. What are the signs or symptoms? The only symptom of this condition may be a small, painless lump underneath the skin. When an epidermal cyst ruptures, it may become inflamed. True infection in cysts is rare. Symptoms may include:  Redness.  Inflammation.  Tenderness.  Warmth.  Keratin draining from the cyst. Keratin is grayish-white, bad-smelling substance.  Pus draining from the cyst. How is this diagnosed? This condition is diagnosed with a physical exam.  In some cases, you may have a sample of tissue (biopsy) taken from your cyst to be examined under a microscope or tested for bacteria.  You may be referred to a health care provider who specializes in skin  care (dermatologist). How is this treated? If a cyst becomes inflamed, treatment may include:  Opening and draining the cyst, done by a health care provider. After draining, minor surgery to remove the rest of the cyst may be done.  Taking antibiotic medicine.  Having injections of medicines (steroids) that help to reduce inflammation.  Having surgery to remove the cyst. Surgery may be done if the cyst: ? Becomes large. ? Bothers you. ? Has a chance of turning into cancer.  Do not try to open a cyst yourself. Follow these instructions at home: Medicines  If you were prescribed an antibiotic medicine, take it it as told by your health care provider. Do not stop using the antibiotic even if you start to feel better.  Take over-the-counter and prescription medicines only as told by your health care provider. General instructions  Keep the area around your cyst clean and dry.  Wear loose, dry clothing.  Avoid touching your cyst.  Check your cyst every day for signs of infection. Check for: ? Redness, swelling, or pain. ? Fluid or blood. ? Warmth. ? Pus or a bad smell.  Keep all follow-up visits. This is important. How is this prevented?  Wear clean, dry, clothing.  Avoid wearing tight clothing.  Keep your skin clean and dry. Take showers or baths every day. Contact a health care provider if:  Your cyst develops symptoms of infection.  Your condition is not improving or is getting worse.  You develop a cyst that looks different from other cysts you have  had.  You have a fever. Get help right away if:  Redness spreads from the cyst into the surrounding area. Summary  An epidermoid cyst is a sac made of skin tissue. These cysts are usually harmless (benign), and they may not cause symptoms unless they become inflamed.  If a cyst becomes inflamed, treatment may include surgery to open and drain the cyst, or to remove it. Treatment may also include medicines by  mouth or through an injection.  Take over-the-counter and prescription medicines only as told by your health care provider. If you were prescribed an antibiotic medicine, take it as told by your health care provider. Do not stop using the antibiotic even if you start to feel better.  Contact a health care provider if your condition is not improving or is getting worse.  Keep all follow-up visits as told by your health care provider. This is important. This information is not intended to replace advice given to you by your health care provider. Make sure you discuss any questions you have with your health care provider. Document Revised: 05/26/2019 Document Reviewed: 05/26/2019 Elsevier Patient Education  [place].

## 2020-08-03 ENCOUNTER — Encounter: Payer: Self-pay | Admitting: Family Medicine

## 2020-08-03 DIAGNOSIS — L723 Sebaceous cyst: Secondary | ICD-10-CM

## 2020-08-31 ENCOUNTER — Other Ambulatory Visit: Payer: Self-pay | Admitting: Family Medicine

## 2020-10-07 ENCOUNTER — Encounter: Payer: Self-pay | Admitting: Family Medicine

## 2020-10-07 DIAGNOSIS — L299 Pruritus, unspecified: Secondary | ICD-10-CM

## 2020-10-07 DIAGNOSIS — N6459 Other signs and symptoms in breast: Secondary | ICD-10-CM

## 2020-10-09 MED ORDER — GABAPENTIN 100 MG PO CAPS: 100.0000 mg | ORAL_CAPSULE | Freq: Three times a day (TID) | ORAL | 3 refills | Status: DC

## 2020-10-15 ENCOUNTER — Encounter: Payer: Self-pay | Admitting: Family Medicine

## 2020-10-16 ENCOUNTER — Other Ambulatory Visit: Payer: Self-pay

## 2020-10-16 MED ORDER — ESOMEPRAZOLE MAGNESIUM 40 MG PO CPDR: 40.0000 mg | DELAYED_RELEASE_CAPSULE | Freq: Two times a day (BID) | ORAL | 1 refills | Status: DC

## 2020-12-11 ENCOUNTER — Ambulatory Visit: Payer: Self-pay | Admitting: Surgery

## 2020-12-11 NOTE — H&P (Signed)
History of Present Illness: Grace Moore is a 35 y.o. female who was referred to me for evaluation of a cyst on the scalp.  She first noticed the lump on her scalp earlier this year, and first had it evaluated in January.  It causes her some pain, especially when brushing or washing her hair.  She was recently evaluated by her PCP who noticed some redness and was concerned for infection.  She took a course of antibiotics, but did not notice any change in her symptoms.  She has not had any drainage from the area.       Review of Systems: A complete review of systems was obtained from the patient.  I have reviewed this information and discussed as appropriate with the patient.  See HPI as well for other ROS.     Medical History: Past Medical History Past Medical History: Diagnosis Date  Anemia    GERD (gastroesophageal reflux disease)        Patient Active Problem List Diagnosis  Sebaceous cyst     Past Surgical History History reviewed. No pertinent surgical history.     Allergies No Known Allergies    Current Outpatient Medications on File Prior to Visit Medication Sig Dispense Refill  cholecalciferol (VITAMIN D3) 1000 unit tablet Take by mouth      esomeprazole (NEXIUM) 20 MG DR capsule Take 20 mg by mouth      multivit-min-ferrous sulfate 4.5 mg iron PwPk Take by mouth      zonisamide (ZONEGRAN) 50 MG capsule Take 100 mg by mouth once daily       No current facility-administered medications on file prior to visit.     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   Physical Exam Vitals reviewed.  Constitutional:      General: She is not in acute distress.    Appearance: Normal appearance.  HENT:     Head: Normocephalic and atraumatic.  Eyes:     General: No scleral icterus.    Conjunctiva/sclera: Conjunctivae normal.  Pulmonary:     Effort: Pulmonary effort is normal. No respiratory distress.  Musculoskeletal:        General: No swelling or deformity. Normal range of motion.     Cervical back: Normal range of motion.  Skin:    Comments: Subcutaneous nodule on the left parietal scalp, approximately 2 cm in diameter, well-circumscribed with an overlying pore.  Neurological:     General: No focal deficit present.     Mental Status: She is alert and oriented to person, place, and time.  Psychiatric:        Mood and Affect: Mood normal.  Behavior: Behavior normal.        Thought Content: Thought content normal.            Assessment and Plan: Diagnoses and all orders for this visit:   Sebaceous cyst       This is a 35 year old female presenting with a cyst on the scalp.  It is symptomatic and she would like to have it excised.  I discussed the procedure details.  Because it is on the scalp which is a relatively vascular area I recommended this be done in the operating room.  Patient reports she has previously had skin reactions to sutures, but did not have any issues after her cholecystectomy.  On review of her lap chole op note Monocryl was used to close these port sites, which I plan to use on her scalp.  She agrees to proceed with the procedure.  She will be contacted to schedule an elective  surgery date.  Michaelle Birks, MD [facility] Surgery General, Hepatobiliary and Pancreatic Surgery 12/11/20 2:38 PM

## 2021-01-12 ENCOUNTER — Encounter: Payer: BC Managed Care – PPO | Admitting: Family Medicine

## 2021-01-31 ENCOUNTER — Encounter: Payer: Self-pay | Admitting: Dermatology

## 2021-01-31 ENCOUNTER — Ambulatory Visit (INDEPENDENT_AMBULATORY_CARE_PROVIDER_SITE_OTHER): Payer: BC Managed Care – PPO | Admitting: Dermatology

## 2021-01-31 ENCOUNTER — Other Ambulatory Visit: Payer: Self-pay

## 2021-01-31 DIAGNOSIS — Z1283 Encounter for screening for malignant neoplasm of skin: Secondary | ICD-10-CM | POA: Diagnosis not present

## 2021-01-31 DIAGNOSIS — R21 Rash and other nonspecific skin eruption: Secondary | ICD-10-CM | POA: Diagnosis not present

## 2021-01-31 MED ORDER — DOXEPIN HCL 5 % EX CREA: 1.0000 "application " | TOPICAL_CREAM | Freq: Every day | CUTANEOUS | 1 refills | Status: DC

## 2021-01-31 NOTE — Patient Instructions (Addendum)
CeraVe over the counter with pramoxine red label 1-2 times daily if this fails pick up prescription and apply 1-2 times daily not more than 4 times and could cause you to be drowsy.

## 2021-02-05 ENCOUNTER — Telehealth: Payer: Self-pay | Admitting: *Deleted

## 2021-02-05 NOTE — Telephone Encounter (Signed)
Prior authorization done via cover my Meds.   KeyElouise Munroe - PA Case ID: [id number] - Rx #: [id number]

## 2021-02-14 ENCOUNTER — Encounter (HOSPITAL_BASED_OUTPATIENT_CLINIC_OR_DEPARTMENT_OTHER): Payer: Self-pay | Admitting: Surgery

## 2021-02-14 ENCOUNTER — Other Ambulatory Visit: Payer: Self-pay

## 2021-02-16 ENCOUNTER — Encounter: Payer: BC Managed Care – PPO | Admitting: Family Medicine

## 2021-02-18 ENCOUNTER — Encounter: Payer: Self-pay | Admitting: Dermatology

## 2021-02-18 NOTE — Progress Notes (Signed)
° °  New Patient   Subjective  Grace Moore is a 35 y.o. female who presents for the following: Rash (Itches like a Rash on the right nipple only x 6 months to a year treatment otc aquaphor & ecurine for dry skin itch, tried gabapentin 100mg  3 times a day = jaw swelling  she d/c that. No visible lesion just itches, Gyno exam breats normal ).  Itching near right nipple, never visible rash. Location:  Duration:  Quality:  Associated Signs/Symptoms: Modifying Factors:  Severity:  Timing: Context:    The following portions of the chart were reviewed this encounter and updated as appropriate:  Tobacco   Allergies   Meds   Problems   Med Hx   Surg Hx   Fam Hx       Objective  Well appearing patient in no apparent distress; mood and affect are within normal limits. Torso - Posterior (Back) Waist up exam: Atypical pigmented lesions or nonmelanoma skin cancer.  Angioma on the back   Right Breast Right breast and left examined: normal montgomery tubercles below the nipple.  No actual rash or exam is changing at site of itching which is below the area of the.  No palpable pathology.    All skin waist up examined.   Assessment & Plan  Screening exam for skin cancer Torso - Posterior (Back)  Self examine with spouse twice annually.  Continue ultraviolet protection.  Rash and other nonspecific skin eruption Right Breast  Cerave Pramoxine hydrochloride anti-itch lotion over the counter .  Recheck if she develops any visible skin changes.  Doxepin HCl (ZONALON) 5 % CREA - Right Breast Apply 1 application topically daily.

## 2021-02-19 NOTE — Progress Notes (Signed)
° ° ° ° °  Enhanced Recovery after Surgery for Orthopedics Enhanced Recovery after Surgery is a protocol used to improve the stress on your body and your recovery after surgery.  Patient Instructions  The night before surgery:  No food after midnight. ONLY clear liquids after midnight  The day of surgery (if you do NOT have diabetes):  Drink ONE (1) Pre-Surgery Clear Ensure as directed.   This drink was given to you during your hospital  pre-op appointment visit. The pre-op nurse will instruct you on the time to drink the  Pre-Surgery Ensure depending on your surgery time. Finish the drink at the designated time by the pre-op nurse.  Nothing else to drink after completing the  Pre-Surgery Clear Ensure.  The day of surgery (if you have diabetes): Drink ONE (1) Gatorade 2 (G2) as directed. This drink was given to you during your hospital  pre-op appointment visit.  The pre-op nurse will instruct you on the time to drink the   Gatorade 2 (G2) depending on your surgery time. Color of the Gatorade may vary. Red is not allowed. Nothing else to drink after completing the  Gatorade 2 (G2).         If you have questions, please contact your surgeons office.  Pre-surgery soap given with instructions. Patient verbalized understanding.

## 2021-02-21 ENCOUNTER — Ambulatory Visit (HOSPITAL_BASED_OUTPATIENT_CLINIC_OR_DEPARTMENT_OTHER): Payer: BC Managed Care – PPO | Admitting: Certified Registered Nurse Anesthetist

## 2021-02-21 ENCOUNTER — Ambulatory Visit (HOSPITAL_BASED_OUTPATIENT_CLINIC_OR_DEPARTMENT_OTHER)
Admission: RE | Admit: 2021-02-21 | Discharge: 2021-02-21 | Disposition: A | Discharge status: Home / Self Care | Payer: BC Managed Care – PPO | Attending: Surgery | Admitting: Surgery

## 2021-02-21 ENCOUNTER — Encounter (HOSPITAL_BASED_OUTPATIENT_CLINIC_OR_DEPARTMENT_OTHER)
Admission: RE | Disposition: A | Discharge status: Home / Self Care | Payer: Self-pay | Source: Home / Self Care | Attending: Surgery

## 2021-02-21 ENCOUNTER — Encounter (HOSPITAL_BASED_OUTPATIENT_CLINIC_OR_DEPARTMENT_OTHER): Payer: Self-pay | Admitting: Surgery

## 2021-02-21 ENCOUNTER — Other Ambulatory Visit: Payer: Self-pay

## 2021-02-21 DIAGNOSIS — Z6841 Body Mass Index (BMI) 40.0 and over, adult: Secondary | ICD-10-CM | POA: Diagnosis not present

## 2021-02-21 DIAGNOSIS — L7211 Pilar cyst: Secondary | ICD-10-CM | POA: Insufficient documentation

## 2021-02-21 DIAGNOSIS — K219 Gastro-esophageal reflux disease without esophagitis: Secondary | ICD-10-CM | POA: Diagnosis not present

## 2021-02-21 HISTORY — PX: CYST EXCISION: SHX5701

## 2021-02-21 LAB — POCT PREGNANCY, URINE: Preg Test, Ur: NEGATIVE

## 2021-02-21 SURGERY — CYST REMOVAL
Anesthesia: General | Site: Head

## 2021-02-21 MED ORDER — LACTATED RINGERS IV SOLN: INTRAVENOUS | Status: DC

## 2021-02-21 MED ORDER — FENTANYL CITRATE (PF) 100 MCG/2ML IJ SOLN
INTRAMUSCULAR | Status: DC | PRN
  Administered 2021-02-21 (×2): 50 ug via INTRAVENOUS

## 2021-02-21 MED ORDER — GABAPENTIN 300 MG PO CAPS
300.0000 mg | ORAL_CAPSULE | ORAL | Status: AC
  Administered 2021-02-21: 08:00:00 300 mg via ORAL

## 2021-02-21 MED ORDER — CEFAZOLIN SODIUM-DEXTROSE 2-4 GM/100ML-% IV SOLN
INTRAVENOUS | Status: AC
  Filled 2021-02-21: qty 100

## 2021-02-21 MED ORDER — FENTANYL CITRATE (PF) 100 MCG/2ML IJ SOLN
INTRAMUSCULAR | Status: AC
  Filled 2021-02-21: qty 2

## 2021-02-21 MED ORDER — ONDANSETRON HCL 4 MG/2ML IJ SOLN
INTRAMUSCULAR | Status: DC | PRN
  Administered 2021-02-21: 4 mg via INTRAVENOUS

## 2021-02-21 MED ORDER — 0.9 % SODIUM CHLORIDE (POUR BTL) OPTIME
TOPICAL | Status: DC | PRN
  Administered 2021-02-21: 09:00:00 150 mL

## 2021-02-21 MED ORDER — MIDAZOLAM HCL 5 MG/5ML IJ SOLN
INTRAMUSCULAR | Status: DC | PRN
  Administered 2021-02-21: 2 mg via INTRAVENOUS

## 2021-02-21 MED ORDER — ACETAMINOPHEN 500 MG PO TABS
1000.0000 mg | ORAL_TABLET | ORAL | Status: AC
  Administered 2021-02-21: 08:00:00 1000 mg via ORAL

## 2021-02-21 MED ORDER — OXYCODONE HCL 5 MG PO TABS: 5.0000 mg | ORAL_TABLET | Freq: Once | ORAL | Status: DC | PRN

## 2021-02-21 MED ORDER — TRAMADOL HCL 50 MG PO TABS: 50.0000 mg | ORAL_TABLET | Freq: Four times a day (QID) | ORAL | 0 refills | Status: AC | PRN

## 2021-02-21 MED ORDER — LIDOCAINE HCL (CARDIAC) PF 100 MG/5ML IV SOSY
PREFILLED_SYRINGE | INTRAVENOUS | Status: DC | PRN
  Administered 2021-02-21: 80 mg via INTRAVENOUS

## 2021-02-21 MED ORDER — ACETAMINOPHEN 500 MG PO TABS
ORAL_TABLET | ORAL | Status: AC
  Filled 2021-02-21: qty 2

## 2021-02-21 MED ORDER — PROPOFOL 10 MG/ML IV BOLUS
INTRAVENOUS | Status: AC
  Filled 2021-02-21: qty 20

## 2021-02-21 MED ORDER — HYDROMORPHONE HCL 1 MG/ML IJ SOLN
0.2500 mg | INTRAMUSCULAR | Status: DC | PRN
Start: 2021-02-21 — End: 2021-02-21

## 2021-02-21 MED ORDER — CHLORHEXIDINE GLUCONATE CLOTH 2 % EX PADS: 6.0000 | MEDICATED_PAD | Freq: Once | CUTANEOUS | Status: DC

## 2021-02-21 MED ORDER — MEPERIDINE HCL 25 MG/ML IJ SOLN: 6.2500 mg | INTRAMUSCULAR | Status: DC | PRN

## 2021-02-21 MED ORDER — BUPIVACAINE-EPINEPHRINE (PF) 0.25% -1:200000 IJ SOLN
INTRAMUSCULAR | Status: DC | PRN
  Administered 2021-02-21: 10 mL

## 2021-02-21 MED ORDER — ONDANSETRON HCL 4 MG/2ML IJ SOLN
INTRAMUSCULAR | Status: AC
  Filled 2021-02-21: qty 2

## 2021-02-21 MED ORDER — PROMETHAZINE HCL 25 MG/ML IJ SOLN: 6.2500 mg | INTRAMUSCULAR | Status: DC | PRN

## 2021-02-21 MED ORDER — OXYCODONE HCL 5 MG/5ML PO SOLN: 5.0000 mg | Freq: Once | ORAL | Status: DC | PRN

## 2021-02-21 MED ORDER — CEFAZOLIN SODIUM-DEXTROSE 2-4 GM/100ML-% IV SOLN
2.0000 g | INTRAVENOUS | Status: AC
  Administered 2021-02-21: 09:00:00 2 g via INTRAVENOUS

## 2021-02-21 MED ORDER — GABAPENTIN 300 MG PO CAPS
ORAL_CAPSULE | ORAL | Status: AC
  Filled 2021-02-21: qty 1

## 2021-02-21 MED ORDER — AMISULPRIDE (ANTIEMETIC) 5 MG/2ML IV SOLN: 10.0000 mg | Freq: Once | INTRAVENOUS | Status: DC | PRN

## 2021-02-21 MED ORDER — MIDAZOLAM HCL 2 MG/2ML IJ SOLN
INTRAMUSCULAR | Status: AC
  Filled 2021-02-21: qty 2

## 2021-02-21 MED ORDER — PROPOFOL 10 MG/ML IV BOLUS
INTRAVENOUS | Status: DC | PRN
  Administered 2021-02-21: 200 mg via INTRAVENOUS

## 2021-02-21 MED ORDER — BUPIVACAINE-EPINEPHRINE (PF) 0.25% -1:200000 IJ SOLN
INTRAMUSCULAR | Status: AC
  Filled 2021-02-21: qty 150

## 2021-02-21 MED ORDER — DEXAMETHASONE SODIUM PHOSPHATE 4 MG/ML IJ SOLN
INTRAMUSCULAR | Status: DC | PRN
  Administered 2021-02-21: 5 mg via INTRAVENOUS

## 2021-02-21 SURGICAL SUPPLY — 36 items
BLADE SURG 15 STRL LF DISP TIS (BLADE) ×1 IMPLANT
BLADE SURG 15 STRL SS (BLADE) ×3
CANISTER SUCT 1200ML W/VALVE (MISCELLANEOUS) ×2 IMPLANT
COVER BACK TABLE 60X90IN (DRAPES) ×3 IMPLANT
COVER MAYO STAND STRL (DRAPES) ×3 IMPLANT
DRAPE LAPAROTOMY 100X72 PEDS (DRAPES) ×3 IMPLANT
DRAPE UTILITY XL STRL (DRAPES) ×3 IMPLANT
ELECT REM PT RETURN 9FT ADLT (ELECTROSURGICAL) ×3
ELECTRODE REM PT RTRN 9FT ADLT (ELECTROSURGICAL) ×1 IMPLANT
GLOVE SURG POLY MICRO LF SZ5.5 (GLOVE) ×3 IMPLANT
GLOVE SURG UNDER POLY LF SZ6 (GLOVE) ×3 IMPLANT
GLOVE SURG UNDER POLY LF SZ7 (GLOVE) ×2 IMPLANT
GOWN STRL REUS W/ TWL LRG LVL3 (GOWN DISPOSABLE) ×2 IMPLANT
GOWN STRL REUS W/TWL LRG LVL3 (GOWN DISPOSABLE) ×6
NDL HYPO 25X1 1.5 SAFETY (NEEDLE) ×1 IMPLANT
NEEDLE HYPO 25X1 1.5 SAFETY (NEEDLE) ×3 IMPLANT
NS IRRIG 1000ML POUR BTL (IV SOLUTION) ×2 IMPLANT
PACK BASIN DAY SURGERY FS (CUSTOM PROCEDURE TRAY) ×3 IMPLANT
PENCIL SMOKE EVACUATOR (MISCELLANEOUS) ×3 IMPLANT
SLEEVE SCD COMPRESS KNEE MED (STOCKING) ×3 IMPLANT
SPONGE T-LAP 4X18 ~~LOC~~+RFID (SPONGE) ×3 IMPLANT
SUT ETHILON 3 0 PS 1 (SUTURE) IMPLANT
SUT ETHILON 4 0 PS 2 18 (SUTURE) IMPLANT
SUT MNCRL AB 3-0 PS2 18 (SUTURE) ×2 IMPLANT
SUT MON AB 4-0 PC3 18 (SUTURE) ×3 IMPLANT
SUT SILK 2 0 TIES 17X18 (SUTURE)
SUT SILK 2-0 18XBRD TIE BLK (SUTURE) IMPLANT
SUT VIC AB 3-0 SH 27 (SUTURE) ×3
SUT VIC AB 3-0 SH 27X BRD (SUTURE) ×1 IMPLANT
SUT VICRYL 3-0 CR8 SH (SUTURE) IMPLANT
SYR CONTROL 10ML LL (SYRINGE) ×3 IMPLANT
TOWEL GREEN STERILE FF (TOWEL DISPOSABLE) ×3 IMPLANT
TRAY DSU PREP LF (CUSTOM PROCEDURE TRAY) ×2 IMPLANT
TUBE CONNECTING 20'X1/4 (TUBING) ×1
TUBE CONNECTING 20X1/4 (TUBING) ×1 IMPLANT
YANKAUER SUCT BULB TIP NO VENT (SUCTIONS) ×2 IMPLANT

## 2021-02-21 NOTE — Transfer of Care (Signed)
Immediate Anesthesia Transfer of Care Note  Patient: RAELYNN CORRON  Procedure(s) Performed: EXCISION OF SCLAP SEBACEOUS CYST (Head)  Patient Location: PACU  Anesthesia Type:General  Level of Consciousness: awake, alert  and oriented  Airway & Oxygen Therapy: Patient Spontanous Breathing and Patient connected to nasal cannula oxygen  Post-op Assessment: Report given to RN and Post -op Vital signs reviewed and stable  Post vital signs: Reviewed and stable  Last Vitals:  Vitals Value Taken Time  BP 130/62 02/21/21 0936  Temp 36.6 C 02/21/21 0936  Pulse 89 02/21/21 0936  Resp 18 02/21/21 0936  SpO2 98 % 02/21/21 0936  Vitals shown include unvalidated device data.  Last Pain:  Vitals:   02/21/21 0831  TempSrc: Oral  PainSc:       Patients Stated Pain Goal: 5 (49/17/91 5056)  Complications: No notable events documented.

## 2021-02-21 NOTE — Op Note (Signed)
Date: 02/21/21  Patient: Grace Moore MRN: [id number]  Preoperative Diagnosis: Inclusion cyst of scalp Postoperative Diagnosis: Same  Procedure: Excision of inclusion cyst from scalp  Surgeon: Michaelle Birks, MD  EBL: Minimal  Anesthesia: General LMA  Specimens: Scalp cyst  Indications: Ms. Mcdade is a 35 yo female who presented with a cystic lesion on the scalp. It caused her pain and discomfort, and she elected to proceed with excision.  Findings: Small inclusion cyst of parietal scalp, excised dimensions of 2cm by 1cm.  Procedure details: Informed consent was obtained in the preoperative area prior to the procedure. The patient was brought to the operating room and placed on the table in the supine position. General anesthesia was induced and appropriate lines and drains were placed for intraoperative monitoring. Perioperative antibiotics were administered per SCIP guidelines. The scalp was prepped and draped in the usual sterile fashion. A pre-procedure timeout was taken verifying patient identity, surgical site and procedure to be performed.  An elliptical skin incision was made around the lesion. The subcutaneous tissue was divided with cautery, and the cyst was completely excised with a small margin of surrounding subcutaneous tissue. The total excised dimensions were 2cm in length by 1cm in width. The wound was irrigated and hemostasis was achieved with cautery. The deep dermal layer was closed with interrupted 3-0 Vicryl suture. The skin was closed with simple interrupted 3-0 monocryl suture.   The patient tolerated the procedure with no apparent complications. All counts were correct x2 at the end of the procedure. The patient was extubated and taken to PACU in stable condition.  Michaelle Birks, MD 02/21/21 9:33 AM

## 2021-02-21 NOTE — Anesthesia Postprocedure Evaluation (Signed)
Anesthesia Post Note  Patient: DAVI ROTAN  Procedure(s) Performed: EXCISION OF SCLAP SEBACEOUS CYST (Head)     Patient location during evaluation: PACU Anesthesia Type: General Level of consciousness: awake and alert Pain management: pain level controlled Vital Signs Assessment: post-procedure vital signs reviewed and stable Respiratory status: spontaneous breathing, nonlabored ventilation and respiratory function stable Cardiovascular status: blood pressure returned to baseline and stable Postop Assessment: no apparent nausea or vomiting Anesthetic complications: no   No notable events documented.  Last Vitals:  Vitals:   02/21/21 1000 02/21/21 1012  BP: (!) 106/53 90/60  Pulse: 63 61  Resp: 13 16  Temp:  36.6 C  SpO2: 97% 96%    Last Pain:  Vitals:   02/21/21 1012  TempSrc:   PainSc: 0-No pain                 Lynda Rainwater

## 2021-02-21 NOTE — Discharge Instructions (Addendum)
[facility] DISCHARGE INSTRUCTIONS  Activity You may resume activities as tolerated. Ok to shower in 24 hours, but do not bathe or submerge incision underwater for 2 weeks. Do not drive while taking narcotic pain medication.  Wound Care Your incision has stitches that will dissolve after 1-2 weeks. You may notice a little bit of drainage from the incision, which is normal. If you have a lot of drainage, or notice bleeding or foul-smelling fluid, please call the office. You may shower and wash your hair in 24 hours - be gentle near the incision. Do not submerge your incision underwater for 2 weeks. Monitor your incision for any new redness, tenderness, or drainage.  When to Call us: Fever greater than 100.5 New redness, drainage, or swelling at incision site Severe pain, nausea, or vomiting  Follow-up You will have a follow up appointment with Dr. Zenia Resides in 3-4 weeks to check your incision. This will be at the [facility] office at [address], [place], [place], [place]. Please arrive at least 15 minutes prior to your scheduled appointment time.  For questions or concerns, please call the office at [phone].  No Tylenol until 2 pm   Post Anesthesia Home Care Instructions  Activity: Get plenty of rest for the remainder of the day. A responsible individual must stay with you for 24 hours following the procedure.  For the next 24 hours, DO NOT: -Drive a car -Paediatric nurse -Drink alcoholic beverages -Take any medication unless instructed by your physician -Make any legal decisions or sign important papers.  Meals: Start with liquid foods such as gelatin or soup. Progress to regular foods as tolerated. Avoid greasy, spicy, heavy foods. If nausea and/or vomiting occur, drink only clear liquids until the nausea and/or vomiting subsides. Call your physician if vomiting continues.  Special Instructions/Symptoms: Your throat may feel dry or sore  from the anesthesia or the breathing tube placed in your throat during surgery. If this causes discomfort, gargle with warm salt water. The discomfort should disappear within 24 hours.  If you had a scopolamine patch placed behind your ear for the management of post- operative nausea and/or vomiting:  1. The medication in the patch is effective for 72 hours, after which it should be removed.  Wrap patch in a tissue and discard in the trash. Wash hands thoroughly with soap and water. 2. You may remove the patch earlier than 72 hours if you experience unpleasant side effects which may include dry mouth, dizziness or visual disturbances. 3. Avoid touching the patch. Wash your hands with soap and water after contact with the patch.

## 2021-02-21 NOTE — H&P (Signed)
Grace Moore is an 35 y.o. female.   Chief Complaint: cyst on scalp HPI: Grace Moore is a 35 yo female with a cystic lesion on the scalp. It has been present for several months and causes pain and discomfort. Shortly after her clinic visit with me it drained and decompressed, however she still has pain in the area. She presents today for removal.  Past Medical History:  Diagnosis Date   GERD (gastroesophageal reflux disease)    Headache    History of kidney stones    passed stone, no surgery    Past Surgical History:  Procedure Laterality Date   CESAREAN SECTION  11/2009   [facility]   CESAREAN SECTION WITH BILATERAL TUBAL LIGATION Bilateral 08/27/2014   Procedure: REPEAT CESAREAN SECTION WITH BILATERAL TUBAL LIGATION ;  Surgeon: Aloha Gell, MD;  Location: [facility] [place] [facility];  Service: Obstetrics;  Laterality: Bilateral;  EDD: 09/03/14   CHOLECYSTECTOMY     WISDOM TOOTH EXTRACTION     WRIST SURGERY     x 2 one left and 1 right     Family History  Problem Relation Age of Onset   Healthy Daughter    Asthma Son    Hypertension Mother    AAA (abdominal aortic aneurysm) Father    Stroke Father    Hypertension Father    Hyperlipidemia Father    Heart attack Father    Breast cancer Maternal Grandmother    Arthritis Maternal Grandmother    Kidney disease Maternal Grandmother    Diabetes Maternal Grandfather    Social History:  reports that she has never smoked. She has never used smokeless tobacco. She reports that she does not drink alcohol and does not use drugs.  Allergies: No Known Allergies  Medications Prior to Admission  Medication Sig Dispense Refill   esomeprazole (NEXIUM) 40 MG capsule Take 1 capsule (40 mg total) by mouth 2 (two) times daily before a meal. 180 capsule 1   Ferrous Sulfate (IRON PO) Take by mouth.     zonisamide (ZONEGRAN) 50 MG capsule TK 2 CS PO D  2    Results for orders placed or performed during the hospital encounter of 02/21/21 (from the past 48 hour(s))   Pregnancy, urine POC     Status: None   Collection Time: 02/21/21  7:43 AM  Result Value Ref Range   Preg Test, Ur NEGATIVE NEGATIVE    Comment:        THE SENSITIVITY OF THIS METHODOLOGY IS >24 mIU/mL    No results found.  Review of Systems  Height 5\' 2"  (1.575 m), weight 113.1 kg, last menstrual period 01/22/2021, unknown if currently breastfeeding. Physical Exam Vitals reviewed.  Constitutional:      General: She is not in acute distress.    Appearance: Normal appearance.  HENT:     Head: Normocephalic and atraumatic.  Eyes:     General: No scleral icterus.    Conjunctiva/sclera: Conjunctivae normal.  Pulmonary:     Effort: Pulmonary effort is normal. No respiratory distress.  Musculoskeletal:        General: Normal range of motion.     Cervical back: Normal range of motion.  Skin:    General: Skin is warm and dry.     Comments: Small skin lesion on the parietal scalp just to the left of midline, approximately 1cm with an overlying pore.  Neurological:     Mental Status: She is alert.     Assessment/Plan 35 yo female with an  inclusion cyst on the scalp. Patient would like excision due to symptoms. Proceed to OR, plan for discharge home from PACU. Informed consent obtained, all questions answered.  Dwan Bolt, MD 02/21/2021, 8:27 AM

## 2021-02-21 NOTE — Anesthesia Procedure Notes (Signed)
Procedure Name: LMA Insertion Date/Time: 02/21/2021 8:48 AM Performed by: Bufford Spikes, CRNA Pre-anesthesia Checklist: Patient identified, Emergency Drugs available, Suction available and Patient being monitored Patient Re-evaluated:Patient Re-evaluated prior to induction Oxygen Delivery Method: Circle system utilized Preoxygenation: Pre-oxygenation with 100% oxygen Induction Type: IV induction Ventilation: Mask ventilation without difficulty LMA: LMA inserted LMA Size: 4.0 Number of attempts: 1 Placement Confirmation: positive ETCO2 Tube secured with: Tape Dental Injury: Teeth and Oropharynx as per pre-operative assessment

## 2021-02-21 NOTE — Anesthesia Preprocedure Evaluation (Signed)
Anesthesia Evaluation  Patient identified by MRN, date of birth, ID band Patient awake    Reviewed: Allergy & Precautions, NPO status , Patient's Chart, lab work & pertinent test results, reviewed documented beta blocker date and time   Airway Mallampati: II   Neck ROM: Full    Dental  (+) Teeth Intact, Dental Advisory Given   Pulmonary    breath sounds clear to auscultation       Cardiovascular  Rhythm:Regular  EKG 2015 OK   Neuro/Psych  Headaches,    GI/Hepatic GERD  Medicated,  Endo/Other  Morbid obesity  Renal/GU      Musculoskeletal   Abdominal (+) + obese,   Peds  Hematology 11/32  Plts 367   Anesthesia Other Findings   Reproductive/Obstetrics                             Anesthesia Physical  Anesthesia Plan  ASA: III  Anesthesia Plan: General   Post-op Pain Management:    Induction: Intravenous  PONV Risk Score and Plan: 3 and Ondansetron, Dexamethasone, Midazolam and Treatment may vary due to age or medical condition  Airway Management Planned: LMA  Additional Equipment:   Intra-op Plan:   Post-operative Plan: Extubation in OR  Informed Consent: I have reviewed the patients History and Physical, chart, labs and discussed the procedure including the risks, benefits and alternatives for the proposed anesthesia with the patient or authorized representative who has indicated his/her understanding and acceptance.       Plan Discussed with:   Anesthesia Plan Comments:         Anesthesia Quick Evaluation

## 2021-02-22 ENCOUNTER — Encounter (HOSPITAL_BASED_OUTPATIENT_CLINIC_OR_DEPARTMENT_OTHER): Payer: Self-pay | Admitting: Surgery

## 2021-02-27 ENCOUNTER — Other Ambulatory Visit: Payer: Self-pay | Admitting: Family Medicine

## 2021-02-28 LAB — SURGICAL PATHOLOGY

## 2021-05-07 ENCOUNTER — Other Ambulatory Visit: Payer: Self-pay

## 2021-05-07 ENCOUNTER — Encounter: Payer: BC Managed Care – PPO | Admitting: Family Medicine

## 2021-05-07 ENCOUNTER — Encounter: Payer: Self-pay | Admitting: Family Medicine

## 2021-05-07 ENCOUNTER — Ambulatory Visit (INDEPENDENT_AMBULATORY_CARE_PROVIDER_SITE_OTHER): Payer: 59 | Admitting: Family Medicine

## 2021-05-07 VITALS — BP 128/85 | HR 75 | Temp 97.2°F | Ht 62.0 in | Wt 253.6 lb

## 2021-05-07 DIAGNOSIS — R5383 Other fatigue: Secondary | ICD-10-CM | POA: Diagnosis not present

## 2021-05-07 DIAGNOSIS — Z0000 Encounter for general adult medical examination without abnormal findings: Secondary | ICD-10-CM | POA: Diagnosis not present

## 2021-05-07 DIAGNOSIS — K219 Gastro-esophageal reflux disease without esophagitis: Secondary | ICD-10-CM | POA: Diagnosis not present

## 2021-05-07 NOTE — Progress Notes (Signed)
Subjective  Chief Complaint  Patient presents with   Annual Exam    Pt is not fasting. She is requesting that it be done in the morning.    Fatigue     HPI: Grace Moore is a 36 y.o. female who presents to [facility] today for a Female Wellness Visit.  She also has the concerns and/or needs as listed above in the chief complaint. These will be addressed in addition to the Health Maintenance Visit.   Wellness Visit: annual visit with health maintenance review and exam without Pap  Health maintenance: 36 year old for physical.  Female wellness up-to-date. Chronic disease management visit and/or acute problem visit: Complains of fatigue.  Had large sebaceous cyst of the scalp removed in December.  Had a 4-week recovery.  However still feels fatigued.  Complains of needing to go to bed earlier than usual.  Sleeps about 8 hours.  Sleeps well.  No snoring.  No observed apnea.  Does not feel rested when she awakens.  Now taking naps.  Reports normal mood.  No chest pain, shortness of breath, fevers, weight changes.  Diet is unchanged.  No caffeine changes.  Drinks 1 cup of coffee per day.  No new medications.  No symptoms of low thyroid.  Having regular. GERD is stable on PPI. Weight: Has gained weight in the last year.  Reports unchanged diet.  Assessment    1. Annual physical exam   2. Gastroesophageal reflux disease without esophagitis   3. Morbid obesity ([place])   4. Other fatigue      Plan  Female Wellness Visit: Age appropriate Health Maintenance and Prevention measures were discussed with patient. Included topics are cancer screening recommendations, ways to keep healthy (see AVS) including dietary and exercise recommendations, regular eye and dental care, use of seat belts, and avoidance of moderate alcohol use and tobacco use.  BMI: discussed patient's BMI and encouraged positive lifestyle modifications to help get to or maintain a target BMI. HM  needs and immunizations were addressed and ordered. See below for orders. See HM and immunization section for updates. Routine labs and screening tests ordered including cmp, cbc and lipids where appropriate. Discussed recommendations regarding Vit D and calcium supplementation (see AVS)  Chronic disease f/u and/or acute problem visit: (deemed necessary to be done in addition to the wellness visit): GERD: Continue Nexium Fatigue: Unclear etiology.  Check lab work.  Rule out anemia and thyroid problems.  Consider sleep apnea.  Denies mood problems.  Further evaluation if not proved.  Question history of mono or COVID.  If not improving over the next 3 months, she will return for further evaluation Morbid obesity: Recommend healthy diet and exercise for weight loss  Follow up: 12 months for complete physical  Orders Placed This Encounter  Procedures   CBC with Differential/Platelet   Comprehensive metabolic panel   Lipid panel   TSH   HIV Antibody (routine testing w rflx)   Epstein-Barr virus VCA antibody panel   POCT urine pregnancy   No orders of the defined types were placed in this encounter.      Body mass index is 46.38 kg/m. Wt Readings from Last 3 Encounters:  05/07/21 253 lb 9.6 oz (115 kg)  02/21/21 249 lb 5.4 oz (113.1 kg)  07/28/20 237 lb 9.6 oz (107.8 kg)     Patient Active Problem List   Diagnosis Date Noted   GERD (gastroesophageal reflux disease) 05/22/2017    Normal  Barium swallow and tablet test 12/2017    Migraine headache 05/22/2017   Morbid obesity ([place]) 05/22/2017   Health Maintenance  Topic Date Due   COVID-19 Vaccine (4 - Booster for Moderna series) 05/23/2021 (Originally 03/24/2020)   INFLUENZA VACCINE  06/01/2021 (Originally 10/02/2020)   PAP SMEAR-Modifier  01/04/2023   TETANUS/TDAP  06/07/2024   HPV VACCINES  Completed   Hepatitis C Screening  Completed   HIV Screening  Completed   Immunization History  Administered Date(s) Administered    DTaP 07/18/1987, 09/18/1987, 11/21/1987, 12/19/1988, 09/27/1992   HPV Quadrivalent 03/04/2011, 05/02/2011, 09/02/2011   Hepatitis B 12/18/1998, 02/01/1999, 06/25/1999   IPV 07/18/1987, 09/18/1987, 12/19/1988, 09/27/1992   Influenza,inj,Quad PF,6+ Mos 11/06/2012, 12/10/2013, 11/09/2015, 03/03/2017, 12/12/2017   Influenza-Unspecified 03/10/2017, 12/31/2019   MMR 09/05/1988, 09/27/1988   Moderna SARS-COV2 Booster Vaccination 01/28/2020   Moderna Sars-Covid-2 Vaccination 04/30/2019, 06/01/2019, 01/28/2020   Td 08/19/2005, 08/19/2005   Tdap 06/08/2014   We updated and reviewed the patient's past history in detail and it is documented below. Allergies: Patient  reports no history of alcohol use. Past Medical History Patient  has a past medical history of GERD (gastroesophageal reflux disease), Headache, and History of kidney stones. Past Surgical History Patient  has a past surgical history that includes Wrist surgery; Cesarean section (11/2009); Cholecystectomy; Wisdom tooth extraction; Cesarean section with bilateral tubal ligation (Bilateral, 08/27/2014); Cyst excision (N/A, 02/21/2021); and Tubal ligation (08/27/2014). 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  Tobacco Use   Smoking status: Never   Smokeless tobacco: Never  Vaping Use   Vaping Use: Never used  Substance and Sexual Activity   Alcohol use: No   Drug use: No   Sexual activity: Yes    Birth control/protection: None    Comment: BTL  Other Topics Concern   Not on file  Social History Narrative   Not on file   Social Determinants of Health   Financial Resource Strain: Not on file  Food Insecurity: Not on file   Transportation Needs: Not on file  Physical Activity: Not on file  Stress: Not on file  Social Connections: Not on file   Family History  Problem Relation Age of Onset   Healthy Daughter    Asthma Son    Hypertension Mother    AAA (abdominal aortic aneurysm) Father    Stroke Father    Hypertension Father    Hyperlipidemia Father    Heart attack Father    Breast cancer Maternal Grandmother    Arthritis Maternal Grandmother    Kidney disease Maternal Grandmother    Diabetes Maternal Grandfather     Review of Systems: Constitutional: negative for fever or malaise Ophthalmic: negative for photophobia, double vision or loss of vision Cardiovascular: negative for chest pain, dyspnea on exertion, or new LE swelling Respiratory: negative for SOB or persistent cough Gastrointestinal: negative for abdominal pain, change in bowel habits or melena Genitourinary: negative for dysuria or gross hematuria, no abnormal uterine bleeding or disharge Musculoskeletal: negative for new gait disturbance or muscular weakness Integumentary: negative for new or persistent rashes, no breast lumps Neurological: negative for TIA or stroke symptoms Psychiatric: negative for SI  or delusions Allergic/Immunologic: negative for hives  Patient Care Team    Relationship Specialty Notifications Start End  Leamon Arnt, MD PCP - General Family Medicine  05/22/17   Aloha Gell, MD Consulting Physician Obstetrics and Gynecology  05/22/17   Orie Rout, MD Consulting Physician Neurology  05/22/17   Posey Pronto, [place] Physician Dentistry  05/22/17   [facility]    05/22/17   Lavonna Monarch, MD Consulting Physician Dermatology  01/31/21     Objective  Vitals: BP 128/85    Pulse 75    Temp (!) 97.2 F (36.2 C) (Temporal)    Ht 5' 2"  (1.575 m)    Wt 253 lb 9.6 oz (115 kg)    LMP 04/09/2021 (Approximate)    SpO2 99%    Breastfeeding No    BMI 46.38 kg/m  General:  Well developed, well  nourished, no acute distress  Psych:  Alert and orientedx3,normal mood and affect HEENT:  Normocephalic, atraumatic, non-icteric sclera, PERRL, supple neck without adenopathy, mass or thyromegaly Cardiovascular:  Normal S1, S2, RRR without gallop, rub or murmur Respiratory:  Good breath sounds bilaterally, CTAB with normal respiratory effort Gastrointestinal: normal bowel sounds, soft, non-tender, no noted masses. No HSM MSK: no deformities, contusions. Joints are without erythema or swelling.  Skin:  Warm, no rashes or suspicious lesions noted Neurologic:    Mental status is normal. Gross motor and sensory exams are normal. Normal gait. No tremor    Commons side effects, risks, benefits, and alternatives for medications and treatment plan prescribed today were discussed, and the patient expressed understanding of the given instructions. Patient is instructed to call or message via MyChart if he/she has any questions or concerns regarding our treatment plan. No barriers to understanding were identified. We discussed Red Flag symptoms and signs in detail. Patient expressed understanding regarding what to do in case of urgent or emergency type symptoms.  Medication list was reconciled, printed and provided to the patient in AVS. Patient instructions and summary information was reviewed with the patient as documented in the AVS. This note was prepared with assistance of Dragon voice recognition software. Occasional wrong-word or sound-a-like substitutions may have occurred due to the inherent limitations of voice recognition software  This visit occurred during the SARS-CoV-2 public health emergency.  Safety protocols were in place, including screening questions prior to the visit, additional usage of staff PPE, and extensive cleaning of exam room while observing appropriate contact time as indicated for disinfecting solutions.

## 2021-05-07 NOTE — Patient Instructions (Signed)
Please return in 12 months for your annual complete physical; please come fasting.  ?Sooner if needed for fatigue. ? ?I will release your lab results to you on your MyChart account with further instructions. You may see the results before I do, but when I review them I will send you a message with my report or have my assistant call you if things need to be discussed. Please reply to my message with any questions. Thank you!  ? ?If you have any questions or concerns, please don't hesitate to send me a message via MyChart or call the office at [phone]. Thank you for visiting with [place] today! It's our pleasure caring for you.  ?

## 2021-05-08 ENCOUNTER — Other Ambulatory Visit (INDEPENDENT_AMBULATORY_CARE_PROVIDER_SITE_OTHER): Payer: 59

## 2021-05-08 DIAGNOSIS — Z0000 Encounter for general adult medical examination without abnormal findings: Secondary | ICD-10-CM

## 2021-05-08 DIAGNOSIS — R5383 Other fatigue: Secondary | ICD-10-CM

## 2021-05-08 LAB — COMPREHENSIVE METABOLIC PANEL
ALT: 17 U/L (ref 0–35)
AST: 15 U/L (ref 0–37)
Albumin: 4 g/dL (ref 3.5–5.2)
Alkaline Phosphatase: 65 U/L (ref 39–117)
BUN: 10 mg/dL (ref 6–23)
CO2: 22 mEq/L (ref 19–32)
Calcium: 8.8 mg/dL (ref 8.4–10.5)
Chloride: 107 mEq/L (ref 96–112)
Creatinine, Ser: 0.79 mg/dL (ref 0.40–1.20)
GFR: 97.9 mL/min (ref >60.00)
Glucose, Bld: 87 mg/dL (ref 70–99)
Potassium: 4.8 mEq/L (ref 3.5–5.1)
Sodium: 137 mEq/L (ref 135–145)
Total Bilirubin: 0.7 mg/dL (ref 0.2–1.2)
Total Protein: 6.4 g/dL (ref 6.0–8.3)

## 2021-05-08 LAB — LIPID PANEL
Cholesterol: 143 mg/dL (ref 0–200)
HDL: 53 mg/dL (ref >39.00)
LDL Cholesterol: 73 mg/dL (ref 0–99)
NonHDL: 90.08
Total CHOL/HDL Ratio: 3
Triglycerides: 83 mg/dL (ref 0.0–149.0)
VLDL: 16.6 mg/dL (ref 0.0–40.0)

## 2021-05-08 LAB — CBC WITH DIFFERENTIAL/PLATELET
Basophils Absolute: 0 10*3/uL (ref 0.0–0.1)
Basophils Relative: 0.7 % (ref 0.0–3.0)
Eosinophils Absolute: 0.1 10*3/uL (ref 0.0–0.7)
Eosinophils Relative: 1.1 % (ref 0.0–5.0)
HCT: 41 % (ref 36.0–46.0)
Hemoglobin: 13.6 g/dL (ref 12.0–15.0)
Lymphocytes Relative: 28.6 % (ref 12.0–46.0)
Lymphs Abs: 1.8 10*3/uL (ref 0.7–4.0)
MCHC: 33.3 g/dL (ref 30.0–36.0)
MCV: 91.4 fl (ref 78.0–100.0)
Monocytes Absolute: 0.4 10*3/uL (ref 0.1–1.0)
Monocytes Relative: 5.9 % (ref 3.0–12.0)
Neutro Abs: 3.9 10*3/uL (ref 1.4–7.7)
Neutrophils Relative %: 63.7 % (ref 43.0–77.0)
Platelets: 284 10*3/uL (ref 150.0–400.0)
RBC: 4.48 Mil/uL (ref 3.87–5.11)
RDW: 13.3 % (ref 11.5–15.5)
WBC: 6.2 10*3/uL (ref 4.0–10.5)

## 2021-05-08 LAB — TSH: TSH: 1.97 u[IU]/mL (ref 0.35–5.50)

## 2021-05-09 LAB — EPSTEIN-BARR VIRUS VCA ANTIBODY PANEL
EBV NA IgG: >600 U/mL — ABNORMAL HIGH
EBV VCA IgG: >750 U/mL — ABNORMAL HIGH
EBV VCA IgM: <36 U/mL

## 2021-06-29 ENCOUNTER — Encounter: Payer: Self-pay | Admitting: Family Medicine

## 2021-06-29 DIAGNOSIS — G43009 Migraine without aura, not intractable, without status migrainosus: Secondary | ICD-10-CM

## 2021-08-13 ENCOUNTER — Encounter: Payer: Self-pay | Admitting: *Deleted

## 2021-08-14 ENCOUNTER — Encounter: Payer: Self-pay | Admitting: Neurology

## 2021-08-14 ENCOUNTER — Ambulatory Visit: Payer: 59 | Admitting: Neurology

## 2021-08-14 VITALS — BP 126/80 | HR 68 | Ht 62.0 in | Wt 252.2 lb

## 2021-08-14 DIAGNOSIS — G43709 Chronic migraine without aura, not intractable, without status migrainosus: Secondary | ICD-10-CM | POA: Diagnosis not present

## 2021-08-14 MED ORDER — RIZATRIPTAN BENZOATE 10 MG PO TBDP: 10.0000 mg | ORAL_TABLET | ORAL | 11 refills | Status: DC | PRN

## 2021-08-14 MED ORDER — ZONISAMIDE 25 MG PO CAPS: ORAL_CAPSULE | ORAL | 6 refills | Status: DC

## 2021-08-14 MED ORDER — ONDANSETRON 4 MG PO TBDP: 4.0000 mg | ORAL_TABLET | Freq: Three times a day (TID) | ORAL | 3 refills | Status: DC | PRN

## 2021-08-14 NOTE — Patient Instructions (Signed)
Decrease Zonegran to '50mg'$  then to '25mg'$  and then stop.  At onset of migraine: Rizatriptan: Please take one tablet at the onset of your headache. If it does not improve the symptoms please take one additional tablet. Do not take more then 2 tablets in 24hrs. Do not take use more then 2 to 3 times in a week. Can take with ondansetron. Nausea: Ondansetron   Meds ordered this encounter  Medications   zonisamide (ZONEGRAN) 25 MG capsule    Sig: Take 2 pills at bedtime and then can decrease to 1 pill nightly in 2 weeks and then stop 2 weeks after as tolerated.    Dispense:  60 capsule    Refill:  6   rizatriptan (MAXALT-MLT) 10 MG disintegrating tablet    Sig: Take 1 tablet (10 mg total) by mouth as needed for migraine. May repeat in 2 hours if needed    Dispense:  9 tablet    Refill:  11   ondansetron (ZOFRAN-ODT) 4 MG disintegrating tablet    Sig: Take 1-2 tablets (4-8 mg total) by mouth every 8 (eight) hours as needed.    Dispense:  30 tablet    Refill:  3     Ondansetron Dissolving Tablets What is this medication? ONDANSETRON (on DAN se tron) prevents nausea and vomiting from chemotherapy, radiation, or surgery. It works by blocking substances in the body that may cause nausea or vomiting. It belongs to a group of medications called antiemetics. This medicine may be used for other purposes; ask your health care provider or pharmacist if you have questions. COMMON BRAND NAME(S): Zofran ODT What should I tell my care team before I take this medication? They need to know if you have any of these conditions: Heart disease History of irregular heartbeat Liver disease Low levels of magnesium or potassium in the blood An unusual or allergic reaction to ondansetron, granisetron, other medications, foods, dyes, or preservatives Pregnant or trying to get pregnant Breast-feeding How should I use this medication? These tablets are made to dissolve in the mouth. Do not try to push the tablet  through the foil backing. With dry hands, peel away the foil backing and gently remove the tablet. Place the tablet in the mouth and allow it to dissolve, then swallow. While you may take these tablets with water, it is not necessary to do so. Talk to your care team regarding the use of this medication in children. Special care may be needed. Overdosage: If you think you have taken too much of this medicine contact a poison control center or emergency room at once. NOTE: This medicine is only for you. Do not share this medicine with others. What if I miss a dose? If you miss a dose, take it as soon as you can. If it is almost time for your next dose, take only that dose. Do not take double or extra doses. What may interact with this medication? Do not take this medication with any of the following: Apomorphine Certain medications for fungal infections like fluconazole, itraconazole, ketoconazole, posaconazole, voriconazole Cisapride Dronedarone Pimozide Thioridazine This medication may also interact with the following: Carbamazepine Certain medications for depression, anxiety, or psychotic disturbances Fentanyl Linezolid MAOIs like Carbex, Eldepryl, Marplan, Nardil, and Parnate Methylene blue (injected into a vein) Other medications that prolong the QT interval (cause an abnormal heart rhythm) like dofetilide, ziprasidone Phenytoin Rifampicin Tramadol This list may not describe all possible interactions. Give your health care provider a list of all the medicines,  herbs, non-prescription drugs, or dietary supplements you use. Also tell them if you smoke, drink alcohol, or use illegal drugs. Some items may interact with your medicine. What should I watch for while using this medication? Check with your care team as soon as you can if you have any sign of an allergic reaction. What side effects may I notice from receiving this medication? Side effects that you should report to your care  team as soon as possible: Allergic reactions--skin rash, itching, hives, swelling of the face, lips, tongue, or throat Bowel blockage--stomach cramping, unable to have a bowel movement or pass gas, loss of appetite, vomiting Chest pain (angina)--pain, pressure, or tightness in the chest, neck, back, or arms Heart rhythm changes--fast or irregular heartbeat, dizziness, feeling faint or lightheaded, chest pain, trouble breathing Irritability, confusion, fast or irregular heartbeat, muscle stiffness, twitching muscles, sweating, high fever, seizure, chills, vomiting, diarrhea, which may be signs of serotonin syndrome Side effects that usually do not require medical attention (report to your care team if they continue or are bothersome): Constipation Diarrhea General discomfort and fatigue Headache This list may not describe all possible side effects. Call your doctor for medical advice about side effects. You may report side effects to FDA at 1-800-FDA-1088. Where should I keep my medication? Keep out of the reach of children and pets. Store between 2 and 30 degrees C (36 and 86 degrees F). Throw away any unused medication after the expiration date. NOTE: This sheet is a summary. It may not cover all possible information. If you have questions about this medicine, talk to your doctor, pharmacist, or health care provider.  2023 Elsevier/Gold Standard (2020-03-24 00:00:00) Rizatriptan Disintegrating Tablets What is this medication? RIZATRIPTAN (rye za TRIP tan) treats migraines. It works by blocking pain signals and narrowing blood vessels in the brain. It belongs to a group of medications called triptans. It is not used to prevent migraines. This medicine may be used for other purposes; ask your health care provider or pharmacist if you have questions. COMMON BRAND NAME(S): Maxalt-MLT What should I tell my care team before I take this medication? They need to know if you have any of these  conditions: Cigarette smoker Circulation problems in fingers and toes Diabetes Heart disease High blood pressure High cholesterol History of irregular heartbeat History of stroke Kidney disease Liver disease Stomach or intestine problems An unusual or allergic reaction to rizatriptan, other medications, foods, dyes, or preservatives Pregnant or trying to get pregnant Breast-feeding How should I use this medication? Take this medication by mouth. Follow the directions on the prescription label. Leave the tablet in the sealed blister pack until you are ready to take it. With dry hands, open the blister and gently remove the tablet. If the tablet breaks or crumbles, throw it away and take a new tablet out of the blister pack. Place the tablet in the mouth and allow it to dissolve, and then swallow. Do not cut, crush, or chew this medication. You do not need water to take this medication. Do not take it more often than directed. Talk to your care team regarding the use of this medication in children. While this medication may be prescribed for children as young as 6 years for selected conditions, precautions do apply. Overdosage: If you think you have taken too much of this medicine contact a poison control center or emergency room at once. NOTE: This medicine is only for you. Do not share this medicine with others. What if I  miss a dose? This does not apply. This medication is not for regular use. What may interact with this medication? Do not take this medication with any of the following medications: Certain medications for migraine headache like almotriptan, eletriptan, frovatriptan, naratriptan, rizatriptan, sumatriptan, zolmitriptan Ergot alkaloids like dihydroergotamine, ergonovine, ergotamine, methylergonovine MAOIs like Carbex, Eldepryl, Marplan, Nardil, and Parnate This medication may also interact with the following medications: Certain medications for depression, anxiety, or  psychotic disorders Propranolol This list may not describe all possible interactions. Give your health care provider a list of all the medicines, herbs, non-prescription drugs, or dietary supplements you use. Also tell them if you smoke, drink alcohol, or use illegal drugs. Some items may interact with your medicine. What should I watch for while using this medication? Visit your care team for regular checks on your progress. Tell your care team if your symptoms do not start to get better or if they get worse. You may get drowsy or dizzy. Do not drive, use machinery, or do anything that needs mental alertness until you know how this medication affects you. Do not stand up or sit up quickly, especially if you are an older patient. This reduces the risk of dizzy or fainting spells. Alcohol may interfere with the effect of this medication. Your mouth may get dry. Chewing sugarless gum or sucking hard candy and drinking plenty of water may help. Contact your care team if the problem does not go away or is severe. If you take migraine medications for 10 or more days a month, your migraines may get worse. Keep a diary of headache days and medication use. Contact your care team if your migraine attacks occur more frequently. What side effects may I notice from receiving this medication? Side effects that you should report to your care team as soon as possible: Allergic reactions--skin rash, itching, hives, swelling of the face, lips, tongue, or throat Burning, pain, tingling, or color changes in the legs or feet Heart attack--pain or tightness in the chest, shoulders, arms, or jaw, nausea, shortness of breath, cold or clammy skin, feeling faint or lightheaded Heart rhythm changes--fast or irregular heartbeat, dizziness, feeling faint or lightheaded, chest pain, trouble breathing Increase in blood pressure Irritability, confusion, fast or irregular heartbeat, muscle stiffness, twitching muscles, sweating,  high fever, seizure, chills, vomiting, diarrhea, which may be signs of serotonin syndrome Raynaud's--cool, numb, or painful fingers or toes that may change color from pale, to blue, to red Seizures Stroke--sudden numbness or weakness of the face, arm, or leg, trouble speaking, confusion, trouble walking, loss of balance or coordination, dizziness, severe headache, change in vision Sudden or severe stomach pain, nausea, vomiting, fever, or bloody diarrhea Vision loss Side effects that usually do not require medical attention (report to your care team if they continue or are bothersome): Dizziness General discomfort or fatigue This list may not describe all possible side effects. Call your doctor for medical advice about side effects. You may report side effects to FDA at 1-800-FDA-1088. Where should I keep my medication? Keep out of the reach of children and pets. Store at room temperature between 15 and 30 degrees C (59 and 86 degrees F). Protect from light and moisture. Throw away any unused medication after the expiration date. NOTE: This sheet is a summary. It may not cover all possible information. If you have questions about this medicine, talk to your doctor, pharmacist, or health care provider.  2023 Elsevier/Gold Standard (2020-03-29 00:00:00)

## 2021-08-14 NOTE — Progress Notes (Signed)
[facility]    Provider:  Dr Jaynee Eagles Requesting Provider: Leamon Arnt, MD Primary Care Provider:  Leamon Arnt, MD  CC:  migraines  HPI:  Grace Moore is a 36 y.o. female here as requested by Leamon Arnt, MD for migraines. PMHx gerd, headache, kidney stones, obesity, migraines. I reviewed [place] and did not find any significant history of her migraines, last seen by Dr. Rosendo Gros in March of this year, she has however been a patient of Dr. Orie Rout from the headache center, I reviewed his notes, patient described a throbbing pressure-like headache with nausea vomiting photophobia and phonophobia with worsening with movement osmophobia present and dizziness.  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  She has had a migraine disorder since 2011 with progressions in September 2018.  She is also had nerve blocks and trigger point injections,   She is here alone, she is on Zonegran and it has helped, she has been on it for 3 years, '100mg'$ , some nausea and she wants to try to go off of it or try another medication. The last time she had a migraine was 9 months ago and she alleve and it went away. She is interested in trying to decrease or stop it since migraine frequency is so little. Pulsating, pounding throbbing, photo and phonophobia. They were severe and daily prior to the zonegran, she had visited the ED, smells would trigger/worsen it, got worse after second child. Had nausea, vomiting. She never tried a triptan, baclofen made her tired.    Reviewed notes, labs and imaging from outside physicians, which showed: 05/08/2021: cbc,cmp,tsh unremarkable.  A review of  records shows patient has tried the following medications that can be used in migraine management: Zonisamide, tramadol, Phenergan  injections and oral, Compazine injections, Zofran injections, Reglan tabs, mag oxide, mag citrate, Toradol injections, ibuprofen tabs, gabapentin, diclofenac tabs, Decadron injections, Fioricet, baclofen, Tylenol, additionally from [facility] of records from [facility], she has tried Excedrin, Benadryl, Sudafed, Zyrtec, Flexeril, nerve blocks, trigger point injections, amitriptyline.   Review of Systems: Patient complains of symptoms per HPI as well as the following symptoms headaches. Pertinent negatives and positives per HPI. All others negative.   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   Education : Manufacturing engineer   Work: Pharmacist, hospital 2 yrs.   Social Determinants of Health   Financial Resource Strain: Not on file  Food Insecurity: Not on file  Transportation Needs: Not on file  Physical Activity: Not on file  Stress: Not on file  Social Connections: Not on file  Intimate Partner Violence: Not on  file    Family History  Problem Relation Age of Onset   Hypertension Mother    AAA (abdominal aortic aneurysm) Father    Stroke Father    Hypertension Father    Hyperlipidemia Father    Heart attack Father    Breast cancer Maternal Grandmother    Arthritis Maternal Grandmother    Kidney disease Maternal Grandmother    Diabetes Maternal Grandfather     Healthy Daughter    Asthma Son    Migraines Neg Hx     Past Medical History:  Diagnosis Date   GERD (gastroesophageal reflux disease)    Headache    History of kidney stones    passed stone, no surgery    Patient Active Problem List   Diagnosis Date Noted   GERD (gastroesophageal reflux disease) 05/22/2017   Migraine headache 05/22/2017   Morbid obesity ([place]) 05/22/2017    Past Surgical History:  Procedure Laterality Date   CESAREAN SECTION  11/2009   [facility]   CESAREAN SECTION WITH BILATERAL TUBAL LIGATION Bilateral 08/27/2014   Procedure: REPEAT CESAREAN SECTION WITH BILATERAL TUBAL LIGATION ;  Surgeon: Aloha Gell, MD;  Location: [facility];  Service: Obstetrics;  Laterality: Bilateral;  EDD: 09/03/14   CHOLECYSTECTOMY     CYST EXCISION N/A 02/21/2021   Procedure: EXCISION OF SCLAP SEBACEOUS CYST;  Surgeon: Dwan Bolt, MD;  Location: [facility];  Service: General;  Laterality: N/A;   TUBAL LIGATION  08/27/2014   WISDOM TOOTH EXTRACTION     WRIST SURGERY     x 2 one left and 1 right     Current Outpatient Medications  Medication Sig Dispense Refill   Cholecalciferol 25 MCG (1000 UT) tablet Take by mouth.     esomeprazole (NEXIUM) 40 MG capsule TAKE 1 CAPSULE BY MOUTH TWICE DAILY BEFORE A MEAL 180 capsule 1   Ferrous Sulfate (IRON PO) Take by mouth.     ondansetron (ZOFRAN-ODT) 4 MG disintegrating tablet Take 1-2 tablets (4-8 mg total) by mouth every 8 (eight) hours as needed. 30 tablet 3   rizatriptan (MAXALT-MLT) 10 MG disintegrating tablet Take 1 tablet (10 mg total) by mouth as needed for migraine. May repeat in 2 hours if needed 9 tablet 11   zonisamide (ZONEGRAN) 25 MG capsule Take 2 pills at bedtime and then can decrease to 1 pill nightly in 2 weeks and then stop 2 weeks after as tolerated. 60 capsule 6   No current facility-administered medications for this visit.    Allergies as of 08/14/2021   (No Known Allergies)    Vitals: BP 126/80   Pulse  68   Ht '5\' 2"'$  (1.575 m)   Wt 252 lb 3.2 oz (114.4 kg)   BMI 46.13 kg/m  Last Weight:  Wt Readings from Last 1 Encounters:  08/14/21 252 lb 3.2 oz (114.4 kg)   Last Height:   Ht Readings from Last 1 Encounters:  08/14/21 '5\' 2"'$  (1.575 m)     Physical exam: Exam: Gen: NAD, conversant, well nourised, obese, well groomed                     CV: RRR, no MRG. No Carotid Bruits. No peripheral edema, warm, nontender Eyes: Conjunctivae clear without exudates or hemorrhage  Neuro: Detailed Neurologic Exam  Speech:    Speech is normal; fluent and spontaneous with normal comprehension.  Cognition:    The patient is oriented to person, place, and time;  recent and remote memory intact;     language fluent;     normal attention, concentration,     fund of knowledge Cranial Nerves:    The pupils are equal, round, and reactive to light. The fundi are normal and spontaneous venous pulsations are present. Visual fields are full to finger confrontation. Extraocular movements are intact. Trigeminal sensation is intact and the muscles of mastication are normal. The face is symmetric. The palate elevates in the midline. Hearing intact. Voice is normal. Shoulder shrug is normal. The tongue has normal motion without fasciculations.   Coordination:    Normal   Gait:    normal.   Motor Observation:    No asymmetry, no atrophy, and no involuntary movements noted. Tone:    Normal muscle tone.    Posture:    Posture is normal. normal erect    Strength:    Strength is V/V in the upper and lower limbs.      Sensation: intact to LT     Reflex Exam:  DTR's:    Deep tendon reflexes in the upper and lower extremities are normal bilaterally.   Toes:    The toes are downgoing bilaterally.   Clonus:    Clonus is absent.    Assessment/Plan: patient with chronic migraines doing extremely well on zonisamide.  She would like to stop the zonisamide at this time and see how she does.  -  Decrease Zonegran to '50mg'$  then to '25mg'$  and then stop.  -At onset of migraine: Rizatriptan: Please take one tablet at the onset of your headache. If it does not improve the symptoms please take one additional tablet. Do not take more then 2 tablets in 24hrs. Do not take use more then 2 to 3 times in a week. Can take with ondansetron. -Nausea: Ondansetron   Meds ordered this encounter  Medications   zonisamide (ZONEGRAN) 25 MG capsule    Sig: Take 2 pills at bedtime and then can decrease to 1 pill nightly in 2 weeks and then stop 2 weeks after as tolerated.    Dispense:  60 capsule    Refill:  6   rizatriptan (MAXALT-MLT) 10 MG disintegrating tablet    Sig: Take 1 tablet (10 mg total) by mouth as needed for migraine. May repeat in 2 hours if needed    Dispense:  9 tablet    Refill:  11   ondansetron (ZOFRAN-ODT) 4 MG disintegrating tablet    Sig: Take 1-2 tablets (4-8 mg total) by mouth every 8 (eight) hours as needed.    Dispense:  30 tablet    Refill:  3    Cc: Leamon Arnt, MD,  Leamon Arnt, MD  Sarina Ill, MD  [facility] [address] [place], [place] [address]  Phone [phone] Fax [phone]

## 2021-08-28 ENCOUNTER — Encounter: Payer: Self-pay | Admitting: Neurology

## 2021-09-02 ENCOUNTER — Other Ambulatory Visit: Payer: Self-pay | Admitting: Neurology

## 2021-09-02 DIAGNOSIS — G43709 Chronic migraine without aura, not intractable, without status migrainosus: Secondary | ICD-10-CM

## 2021-09-02 MED ORDER — AJOVY 225 MG/1.5ML ~~LOC~~ SOAJ: 225.0000 mg | SUBCUTANEOUS | 11 refills | Status: DC

## 2021-09-03 ENCOUNTER — Other Ambulatory Visit: Payer: Self-pay | Admitting: Neurology

## 2021-09-28 ENCOUNTER — Encounter: Payer: Self-pay | Admitting: Neurology

## 2021-09-29 ENCOUNTER — Other Ambulatory Visit: Payer: Self-pay | Admitting: Neurology

## 2021-10-03 ENCOUNTER — Encounter: Payer: Self-pay | Admitting: Neurology

## 2021-10-03 NOTE — Telephone Encounter (Signed)
Per Dr Jaynee Eagles, ok to change Zonisamide to a 90 day supply. Change made.

## 2021-10-13 ENCOUNTER — Encounter: Payer: Self-pay | Admitting: Neurology

## 2021-10-13 ENCOUNTER — Other Ambulatory Visit: Payer: Self-pay | Admitting: Family Medicine

## 2021-10-16 ENCOUNTER — Telehealth: Payer: 59 | Admitting: Neurology

## 2021-10-24 ENCOUNTER — Ambulatory Visit: Payer: 59 | Admitting: Family Medicine

## 2021-10-30 ENCOUNTER — Encounter: Payer: Self-pay | Admitting: Neurology

## 2021-11-26 ENCOUNTER — Encounter: Payer: Self-pay | Admitting: *Deleted

## 2022-01-03 ENCOUNTER — Other Ambulatory Visit: Payer: Self-pay | Admitting: Neurology

## 2022-01-03 NOTE — Telephone Encounter (Signed)
Pt is in weaning process per last documentation August 2023. Mychart message sent to patient.

## 2022-02-14 ENCOUNTER — Encounter: Payer: Self-pay | Admitting: *Deleted

## 2022-04-12 ENCOUNTER — Other Ambulatory Visit: Payer: Self-pay | Admitting: Family Medicine

## 2022-05-09 ENCOUNTER — Encounter: Payer: 59 | Admitting: Family Medicine

## 2022-05-24 ENCOUNTER — Encounter: Payer: 59 | Admitting: Family Medicine

## 2022-09-02 ENCOUNTER — Telehealth: Payer: Self-pay | Admitting: Neurology

## 2022-09-02 DIAGNOSIS — G43709 Chronic migraine without aura, not intractable, without status migrainosus: Secondary | ICD-10-CM

## 2022-09-02 MED ORDER — AJOVY 225 MG/1.5ML ~~LOC~~ SOAJ: 225.0000 mg | SUBCUTANEOUS | 0 refills | Status: DC

## 2022-09-02 NOTE — Telephone Encounter (Signed)
Pt hasn't been seen since June 2023. I will send 1 month supply, please contact pt back and schedule a FU for further refills.

## 2022-09-02 NOTE — Telephone Encounter (Signed)
Pt requesting a refill on Fremanezumab-vfrm (AJOVY) 225 MG/1.5ML SOAJ. Should be sent to  [facility] [phone] - [place],

## 2022-09-02 NOTE — Telephone Encounter (Signed)
Noted, thank you

## 2022-09-02 NOTE — Telephone Encounter (Signed)
Pt has been scheduled in Sept

## 2022-10-08 ENCOUNTER — Other Ambulatory Visit: Payer: Self-pay | Admitting: Family Medicine

## 2022-11-06 ENCOUNTER — Encounter: Payer: Self-pay | Admitting: Neurology

## 2022-11-06 ENCOUNTER — Ambulatory Visit: Payer: No Typology Code available for payment source | Admitting: Neurology

## 2022-11-06 VITALS — BP 132/86 | HR 97 | Ht 63.0 in | Wt 263.0 lb

## 2022-11-06 DIAGNOSIS — G43711 Chronic migraine without aura, intractable, with status migrainosus: Secondary | ICD-10-CM | POA: Diagnosis not present

## 2022-11-06 MED ORDER — RIZATRIPTAN BENZOATE 10 MG PO TBDP: 10.0000 mg | ORAL_TABLET | ORAL | 11 refills | Status: DC | PRN

## 2022-11-06 MED ORDER — EMGALITY 120 MG/ML ~~LOC~~ SOAJ: 120.0000 mg | SUBCUTANEOUS | 11 refills | Status: DC

## 2022-11-06 MED ORDER — EMGALITY 120 MG/ML ~~LOC~~ SOAJ: 240.0000 mg | SUBCUTANEOUS | 11 refills | Status: DC

## 2022-11-06 NOTE — Progress Notes (Signed)
[id number]    Provider:  Dr Lucia Gaskins Requesting Provider: Willow Ora, MD Primary Care Provider:  Willow Ora, MD  CC:  migraines  11/06/2022: Grace Moore is not helping, having daily headaches.  Ajovy stopped working, but she also gets a raised rash that is uncomfortable. We discussed other options like emgality, vyepti. Lating all day. Moderate to severe.  patient says it is throbbing pressure-like headache with nausea vomiting photophobia and phonophobia with worsening with movement, last all day long. Will try Emgality. Omce Emgality is doing well, discuss acute (try rizatriptan and next try Nurtec or Ubrelvy)  Patient complains of symptoms per HPI as well as the following symptoms: none . Pertinent negatives and positives per HPI. All others negative   HPI:  Grace Moore is a 37 y.o. female here as requested by Willow Ora, MD for migraines. PMHx gerd, headache, kidney stones, obesity, migraines. I reviewed Epic and Care Everywhere and did not find any significant history of her migraines, last seen by Dr. Durward Mallard in March of this year, she has however been a patient of Dr. Santiago Glad from the headache center, I reviewed his notes, patient described a throbbing pressure-like headache with nausea vomiting photophobia and phonophobia with worsening with movement osmophobia present and dizziness.  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  She has had a migraine disorder since 2011 with progressions in September 2018.  She is also had nerve blocks and trigger point injections,   She is here alone, she is on Zonegran and it has helped, she has been on it for 3 years, 100mg , some nausea and she wants to try to go off of it or try another medication. The last time she had a migraine was 9 months ago and she alleve  and it went away. She is interested in trying to decrease or stop it since migraine frequency is so little. Pulsating, pounding throbbing, photo and phonophobia. They were severe and daily prior to the zonegran, she had visited the ED, smells would trigger/worsen it, got worse after second child. Had nausea, vomiting. She never tried a triptan, baclofen made her tired.    Reviewed notes, labs and imaging from outside physicians, which showed: 05/08/2021: cbc,cmp,tsh unremarkable.  A review of  records shows patient has tried the following medications that can be used in migraine management: Zonisamide, tramadol, Phenergan injections and oral, Compazine injections, Zofran injections, Reglan tabs, mag oxide, mag citrate, Toradol injections, ibuprofen tabs, gabapentin, diclofenac tabs, Decadron injections, Fioricet, baclofen, Tylenol, additionally from [facility], she has tried Excedrin, Benadryl, Sudafed, Zyrtec, Flexeril, nerve blocks, trigger point injections, amitriptyline, topamax, blood pressure medications contraindicated .due to hypotention, Aimovig contraindicated due to constipation, ajovy(stopped working)   Review of Systems: Patient complains of symptoms per HPI as well as the following symptoms headaches. Pertinent negatives and positives per HPI. All others negative.   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   Education : Tax adviser   Work: Runner, broadcasting/film/video 2 yrs.   Social Determinants of Health   Financial Resource Strain: Low Risk  (08/13/2022)   Received from [facility], [facility]   Overall Financial Resource Strain (CARDIA)    Difficulty of Paying Living Expenses: Not hard at all  Food Insecurity: No Food Insecurity (08/13/2022)   Received from [facility], [facility]   Hunger Vital Sign    Worried About Running Out of Food in the Last Year: Never true    Ran Out of Food in the Last Year: Never true  Transportation Needs: No Transportation Needs (08/13/2022)   Received from [facility], [facility]   PRAPARE - Transportation    Lack of Transportation (Medical): No    Lack of Transportation (Non-Medical): No  Physical Activity: Insufficiently Active (08/13/2022)   Received [facility], [facility]   Exercise Vital Sign    Days of Exercise per Week: 5 days    Minutes of Exercise per Session: 20 min  Stress: No Stress Concern Present (08/13/2022)   Received from [facility] - Occupational Stress Questionnaire    Feeling of Stress : Not at all  Social Connections: Moderately Integrated (08/13/2022)   Received from [facility], [facility]   Social Network    How would you rate your social network (family, work, friends)?: Adequate participation with social networks  Intimate Partner Violence: Not At Risk (08/13/2022)   Received from [facility], [facility]   HITS    Over the last 12 months how often did your partner physically hurt you?: 1    Over the last 12 months how often did your partner insult you or talk down to you?: 1    Over the last 12 months how often did your partner threaten you with physical harm?: 1    Over the last 12 months how often did your partner scream or curse at you?: 1    Family History   Problem Relation Age of Onset   Hypertension Mother    AAA (abdominal aortic aneurysm) Father    Stroke Father    Hypertension Father    Hyperlipidemia Father    Heart attack Father    Breast cancer Maternal Grandmother    Arthritis Maternal Grandmother    Kidney disease Maternal Grandmother    Diabetes Maternal Grandfather    Healthy Daughter    Asthma Son    Migraines Neg Hx     Past Medical History:  Diagnosis Date   GERD (gastroesophageal reflux disease)    Headache    History of kidney stones    passed stone, no surgery    Patient Active Problem List   Diagnosis Date Noted   Chronic migraine without aura, with intractable migraine, so stated, with status migrainosus 11/06/2022   GERD (gastroesophageal reflux disease) 05/22/2017   Migraine headache 05/22/2017   Morbid obesity (HCC) 05/22/2017    Past Surgical History:  Procedure Laterality Date   CESAREAN SECTION  11/2009   [facility]   CESAREAN SECTION WITH BILATERAL TUBAL LIGATION Bilateral 08/27/2014   Procedure: REPEAT CESAREAN  SECTION WITH BILATERAL TUBAL LIGATION ;  Surgeon: Noland Fordyce, MD;  Location: [facility];  Service: Obstetrics;  Laterality: Bilateral;  EDD: 09/03/14   CHOLECYSTECTOMY     CYST EXCISION N/A 02/21/2021   Procedure: EXCISION OF SCLAP SEBACEOUS CYST;  Surgeon: Fritzi Mandes, MD;  Location: [facility];  Service: General;  Laterality: N/A;   TUBAL LIGATION  08/27/2014   WISDOM TOOTH EXTRACTION     WRIST SURGERY     x 2 one left and 1 right     Current Outpatient Medications  Medication Sig Dispense Refill   esomeprazole (NEXIUM) 40 MG capsule TAKE 1 CAPSULE TWICE DAILY BEFORE A MEAL 180 capsule 1   rizatriptan (MAXALT-MLT) 10 MG disintegrating tablet Take 1 tablet (10 mg total) by mouth as needed for migraine. May repeat in 2 hours if needed 9 tablet 11   Galcanezumab-gnlm (EMGALITY) 120 MG/ML SOAJ Inject 120 mg into the skin every 30 (thirty) days. Please use copay card: Rx BIN  [id number] : Group: [id number]  ID#: [id number] Expires: 03/04/2023 PCN: [id number]. Fill loading dose first(240mg ) then the next month, please start filling this prescription thank you. First month is 2 injections. Any further months is one injection. 1 mL 11   No current facility-administered medications for this visit.    Allergies as of 11/06/2022   (No Known Allergies)    Vitals: BP 132/86   Pulse 97   Ht 5\' 3"  (1.6 m)   Wt 263 lb (119.3 kg)   BMI 46.59 kg/m  Last Weight:  Wt Readings from Last 1 Encounters:  11/06/22 263 lb (119.3 kg)   Last Height:   Ht Readings from Last 1 Encounters:  11/06/22 5\' 3"  (1.6 m)     Physical exam: Exam: Gen: NAD, conversant, well nourised, obese, well groomed                     CV: RRR, no MRG. No Carotid Bruits. No peripheral edema, warm, nontender Eyes: Conjunctivae clear without exudates or hemorrhage  Neuro: Detailed Neurologic Exam  Speech:    Speech is normal; fluent and spontaneous with normal comprehension.  Cognition:    The patient is oriented to person, place, and time;     recent and remote memory intact;     language fluent;     normal attention, concentration,     fund of knowledge Cranial Nerves:    The pupils are equal, round, and reactive to light. The fundi are normal and spontaneous venous pulsations are present. Visual fields are full to finger confrontation. Extraocular movements are intact. Trigeminal sensation is intact and the muscles of mastication are normal. The face is symmetric. The palate elevates in the midline. Hearing intact. Voice is normal. Shoulder shrug is normal. The tongue has normal motion without fasciculations.   Coordination:    Normal finger to nose and heel to shin. Normal rapid alternating movements.   Gait:    Heel-toe and tandem gait are normal.   Motor Observation:    No asymmetry, no atrophy, and no involuntary movements noted. Tone:    Normal muscle tone.    Posture:    Posture is  normal. normal erect    Strength:    Strength is V/V in the upper and lower limbs.      Sensation: intact to LT     Reflex Exam:  DTR's:    Deep tendon reflexes in the upper and lower extremities are normal bilaterally.  Toes:    The toes are downgoing bilaterally.   Clonus:    Clonus is absent.  Assessment/Plan: patient with chronic migraines was doing extremely well on zonisamide then on Ajovy but Ajovy stopped working and had a rash try [employer]  Emgality: First Dose is 2 injections (cannot get pregnant for 6 months after stopping) Every month thereafter 1 injection other options inclide, vyepti. Qulipta, botoxx for migrains  Once the migraines are better, we can discuss acute management - try rizatriptan and next try Nurtec or ubrelvy   Meds ordered this encounter  Medications   DISCONTD: Galcanezumab-gnlm (EMGALITY) 120 MG/ML SOAJ    Sig: Inject 240 mg into the skin every 30 (thirty) days. Please use copay card for this loading dose: Rx BIN [id number] : Group: [id number]  ID#: [id number] Expires: 03/04/2023 PCN: PDMI    Dispense:  2 mL    Refill:  11    Please use copay card for this loading dose: Rx BIN [id number] : Group: [id number]  ID#: [id number] Expires: 03/04/2023 PCN: PDMI   rizatriptan (MAXALT-MLT) 10 MG disintegrating tablet    Sig: Take 1 tablet (10 mg total) by mouth as needed for migraine. May repeat in 2 hours if needed    Dispense:  9 tablet    Refill:  11   Galcanezumab-gnlm (EMGALITY) 120 MG/ML SOAJ    Sig: Inject 120 mg into the skin every 30 (thirty) days. Please use copay card: Rx BIN [id number] : Group: [id number]  ID#: [id number] Expires: 03/04/2023 PCN: [id number]. Fill loading dose first(240mg ) then the next month, please start filling this prescription thank you. First month is 2 injections. Any further months is one injection.    Dispense:  1 mL    Refill:  11    Please use copay card: Rx BIN [id number] : Group: [id number]  ID#: [id number] Expires: 03/04/2023 PCN: PDMI.  Fill loading dose first(240mg ).. First month is 2 injections. Any further months is one injection.    Cc: Willow Ora, MD,  Willow Ora, MD  Naomie Dean, MD  [facility] [address] [place], [place] [address]  Phone [phone] Fax [phone]  I spent 30 minutes of face-to-face and non-face-to-face time with patient on the  1. Chronic migraine without aura, with intractable migraine, so stated, with status migrainosus    diagnosis.  This included previsit chart review, lab review, study review, order entry, electronic health record documentation, patient education on the different diagnostic and therapeutic options, counseling and coordination of care, risks and benefits of management, compliance, or risk factor reduction

## 2022-11-06 NOTE — Patient Instructions (Signed)
Emgality: First Dose is 2 injections (cannot get pregnant for 6 months after stopping) Every month thereafter 1 injection other options inclide, vyepti. Qulipta, botoxx for migrains  Once the migraines are better, we can discuss acute management - try rizatriptan and next try Nurtec or ubrelvy  Galcanezumab Injection What is this medication? GALCANEZUMAB (gal ka NEZ ue mab) prevents migraines. It works by blocking a substance in the body that causes migraines. It may also be used to treat cluster headaches. It is a monoclonal antibody. This medicine may be used for other purposes; ask your health care provider or pharmacist if you have questions. COMMON BRAND NAME(S): Emgality What should I tell my care team before I take this medication? They need to know if you have any of these conditions: An unusual or allergic reaction to galcanezumab, other medications, foods, dyes, or preservatives Pregnant or trying to get pregnant Breast-feeding How should I use this medication? This medication is injected under the skin. You will be taught how to prepare and give it. Take it as directed on the prescription label. Keep taking it unless your care team tells you to stop. It is important that you put your used needles and syringes in a special sharps container. Do not put them in a trash can. If you do not have a sharps container, call your pharmacist or care team to get one. Talk to your care team about the use of this medication in children. Special care may be needed. Overdosage: If you think you have taken too much of this medicine contact a poison control center or emergency room at once. NOTE: This medicine is only for you. Do not share this medicine with others. What if I miss a dose? If you miss a dose, take it as soon as you can. If it is almost time for your next dose, take only that dose. Do not take double or extra doses. What may interact with this medication? Interactions are not  expected. This list may not describe all possible interactions. Give your health care provider a list of all the medicines, herbs, non-prescription drugs, or dietary supplements you use. Also tell them if you smoke, drink alcohol, or use illegal drugs. Some items may interact with your medicine. What should I watch for while using this medication? Visit your care team for regular checks on your progress. Tell your care team if your symptoms do not start to get better or if they get worse. What side effects may I notice from receiving this medication? Side effects that you should report to your care team as soon as possible: Allergic reactions or angioedema--skin rash, itching or hives, swelling of the face, eyes, lips, tongue, arms, or legs, trouble swallowing or breathing Side effects that usually do not require medical attention (report to your care team if they continue or are bothersome): Pain, redness, or irritation at injection site This list may not describe all possible side effects. Call your doctor for medical advice about side effects. You may report side effects to FDA at 1-800-FDA-1088. Where should I keep my medication? Keep out of the reach of children and pets. Store in a refrigerator or at room temperature between 20 and 25 degrees C (68 and 77 degrees F). Refrigeration (preferred): Store in the refrigerator. Do not freeze. Keep in the original container until you are ready to take it. Remove the dose from the carton about 30 minutes before it is time for you to use it. If the dose is  not used, it may be stored in original container at room temperature for 7 days. Get rid of any unused medication after the expiration date. Room Temperature: This medication may be stored at room temperature for up to 7 days. Keep it in the original container. Protect from light until time of use. If it is stored at room temperature, get rid of any unused medication after 7 days or after it expires,  whichever is first. To get rid of medications that are no longer needed or have expired: Take the medication to a medication take-back program. Check with your pharmacy or law enforcement to find a location. If you cannot return the medication, ask your pharmacist or care team how to get rid of this medication safely. NOTE: This sheet is a summary. It may not cover all possible information. If you have questions about this medicine, talk to your doctor, pharmacist, or health care provider.  2024 Elsevier/Gold Standard (2021-04-16 00:00:00)

## 2022-11-11 ENCOUNTER — Encounter: Payer: Self-pay | Admitting: Neurology

## 2023-03-21 ENCOUNTER — Telehealth (INDEPENDENT_AMBULATORY_CARE_PROVIDER_SITE_OTHER): Payer: No Typology Code available for payment source | Admitting: Adult Health

## 2023-03-21 DIAGNOSIS — G43909 Migraine, unspecified, not intractable, without status migrainosus: Secondary | ICD-10-CM

## 2023-03-21 DIAGNOSIS — G43711 Chronic migraine without aura, intractable, with status migrainosus: Secondary | ICD-10-CM

## 2023-03-21 MED ORDER — EMGALITY 120 MG/ML ~~LOC~~ SOAJ: 120.0000 mg | SUBCUTANEOUS | 11 refills | Status: DC

## 2023-03-21 MED ORDER — RIZATRIPTAN BENZOATE 10 MG PO TBDP: 10.0000 mg | ORAL_TABLET | ORAL | 11 refills | Status: DC | PRN

## 2023-03-21 NOTE — Progress Notes (Signed)
PATIENT: Grace Moore DOB: 11-Apr-1985  REASON FOR VISIT: follow up HISTORY FROM: patient  Virtual Visit via Video Note  I connected with Grace Moore on 03/21/23 at 10:00 AM EST by a video enabled telemedicine application located remotely at [facility] and verified that I am speaking with the correct person using two identifiers who was located at their own home.   I discussed the limitations of evaluation and management by telemedicine and the availability of in person appointments. The patient expressed understanding and agreed to proceed.   PATIENT: Grace Moore DOB: Mar 12, 1985  REASON FOR VISIT: follow up HISTORY FROM: patient  HISTORY OF PRESENT ILLNESS: Today 03/21/23:  Grace Moore is a 38 y.o. female with a history of migraines. Returns today for follow-up. Currently on emgality for provide rizatriptan for abortive therapy.  She states that the medication seems to be working well.  She only has approximately 2-3 headaches a month and typically rizatriptan will resolve her headache within an hour.  Overall she is happy with her current treatment plan.  Denies any new issues.  Returns today for an evaluation.   HISTORY 11/06/2022: Grace Moore is not helping, having daily headaches.  Ajovy stopped working, but she also gets a raised rash that is uncomfortable. We discussed other options like emgality, vyepti. Lating all day. Moderate to severe.  patient says it is throbbing pressure-like headache with nausea vomiting photophobia and phonophobia with worsening with movement, last all day long. Will try Emgality. Omce Emgality is doing well, discuss acute (try rizatriptan and next try Nurtec or Ubrelvy)   Patient complains of symptoms per HPI as well as the following symptoms: none . Pertinent negatives and positives per HPI. All others negative     HPI:  Grace Moore is a 38 y.o. female here as requested by Willow Ora, MD for  migraines. PMHx gerd, headache, kidney stones, obesity, migraines. I reviewed Epic and Care Everywhere and did not find any significant history of her migraines, last seen by Dr. Durward Mallard in March of this year, she has however been a patient of Dr. Santiago Glad from the headache center, I reviewed his notes, patient described a throbbing pressure-like headache with nausea vomiting photophobia and phonophobia with worsening with movement osmophobia present and dizziness.  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  She has had a migraine disorder since 2011 with progressions in September 2018.  She is also had nerve blocks and trigger point injections,    She is here alone, she is on Zonegran and it has helped, she has been on it for 3 years, 100mg , some nausea and she wants to try to go off of it or try another medication. The last time she had a migraine was 9 months ago and she alleve and it went away. She is interested in trying to decrease or stop it since migraine frequency is so little. Pulsating, pounding throbbing, photo and phonophobia. They were severe and daily prior to the zonegran, she had visited the ED, smells would trigger/worsen it, got worse after second child. Had nausea, vomiting. She never tried a triptan, baclofen made her tired.      Reviewed notes, labs and imaging from outside physicians, which showed: 05/08/2021: cbc,cmp,tsh unremarkable.   A review of  records shows patient has tried the following medications that can be used in migraine  management: Zonisamide, tramadol, Phenergan injections and oral, Compazine injections, Zofran injections, Reglan tabs, mag oxide, mag citrate, Toradol injections, ibuprofen tabs, gabapentin, diclofenac tabs, Decadron injections, Fioricet, baclofen, Tylenol, additionally from [facility] of records from  [facility], she has tried Excedrin, Benadryl, Sudafed, Zyrtec, Flexeril, nerve blocks, trigger point injections, amitriptyline, topamax, blood pressure medications contraindicated .due to hypotention, Aimovig contraindicated due to constipation, ajovy(stopped working)  REVIEW OF SYSTEMS: Out of a complete 14 system review of symptoms, the patient complains only of the following symptoms, and all other reviewed systems are negative.  ALLERGIES: No Known Allergies  HOME MEDICATIONS: Outpatient Medications Prior to Visit  Medication Sig Dispense Refill   esomeprazole (NEXIUM) 40 MG capsule TAKE 1 CAPSULE TWICE DAILY BEFORE A MEAL 180 capsule 1   Galcanezumab-gnlm (EMGALITY) 120 MG/ML SOAJ Inject 120 mg into the skin every 30 (thirty) days. Please use copay card: Rx BIN [id number] : Group: [id number]  ID#: [id number] Expires: 03/04/2023 PCN: [id number]. Fill loading dose first(240mg ) then the next month, please start filling this prescription thank you. First month is 2 injections. Any further months is one injection. 1 mL 11   rizatriptan (MAXALT-MLT) 10 MG disintegrating tablet Take 1 tablet (10 mg total) by mouth as needed for migraine. May repeat in 2 hours if needed 9 tablet 11   No facility-administered medications prior to visit.    PAST MEDICAL HISTORY: Past Medical History:  Diagnosis Date   GERD (gastroesophageal reflux disease)    Headache    History of kidney stones    passed stone, no surgery    PAST SURGICAL HISTORY: Past Surgical History:  Procedure Laterality Date   CESAREAN SECTION  11/2009   [facility]   CESAREAN SECTION WITH BILATERAL TUBAL LIGATION Bilateral 08/27/2014   Procedure: REPEAT CESAREAN SECTION WITH BILATERAL TUBAL LIGATION ;  Surgeon: Noland Fordyce, MD;  Location: [facility];  Service: Obstetrics;  Laterality: Bilateral;  EDD: 09/03/14   CHOLECYSTECTOMY     CYST EXCISION N/A 02/21/2021   Procedure: EXCISION OF SCLAP SEBACEOUS CYST;  Surgeon: Fritzi Mandes,  MD;  Location: [facility];  Service: General;  Laterality: N/A;   TUBAL LIGATION  08/27/2014   WISDOM TOOTH EXTRACTION     WRIST SURGERY     x 2 one left and 1 right     FAMILY HISTORY: Family History  Problem Relation Age of Onset   Hypertension Mother    AAA (abdominal aortic aneurysm) Father    Stroke Father    Hypertension Father    Hyperlipidemia Father    Heart attack Father    Breast cancer Maternal Grandmother    Arthritis Maternal Grandmother    Kidney disease Maternal Grandmother    Diabetes Maternal Grandfather    Healthy Daughter    Asthma Son    Migraines Neg Hx     SOCIAL HISTORY: Social History   Socioeconomic History   Marital status: Married    Spouse name: Vincenza Hews   Number of children: 2   Years of education: Not on file   Highest education level: Not on file  Occupational History   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   Education : Tax adviser   Work: Runner, broadcasting/film/video 2 yrs.   Social Drivers of Corporate investment banker Strain: Low Risk  (08/13/2022)   [facility]   Overall Financial Resource Strain (CARDIA)    Difficulty of Paying Living Expenses: Not hard at all  Food Insecurity: No Food Insecurity (08/13/2022)   Received from [facility]   Hunger Vital Sign    Worried About Running Out of Food in the Last Year: Never true    Ran Out of Food in the Last Year: Never true  Transportation Needs: No Transportation Needs (08/13/2022)   Received from [facility]    PRAPARE - Transportation    Lack of Transportation (Medical): No    Lack of Transportation (Non-Medical): No  Physical Activity: Insufficiently Active (08/13/2022)   Received from [facility]   Exercise Vital Sign    Days of Exercise per Week: 5 days    Minutes of Exercise per Session: 20 min  Stress: No Stress Concern Present (08/13/2022)   Received from [facility] - Occupational Stress Questionnaire    Feeling of Stress : Not at all  Social Connections: Moderately Integrated (08/13/2022)   Received from [facility]   Social Network    How would you rate your social network (family, work, friends)?: Adequate participation with social networks  Intimate Partner Violence: Not At Risk (08/13/2022)   Received from [facility]   HITS    Over the last 12 months how often did your partner physically hurt you?: Never    Over the last 12 months how often did your partner insult you or talk down to you?: Never    Over the last 12 months how often did your partner threaten you with physical harm?: Never    Over the last 12 months how often did your partner scream or curse at you?: Never      PHYSICAL EXAM Generalized: Well developed, in no acute distress   Neurological examination  Mentation: Alert oriented to time, place, history taking. Follows all commands speech and language fluent Cranial nerve II-XII: Facial symmetry noted DIAGNOSTIC DATA (LABS, IMAGING, TESTING) - I reviewed patient records, labs, notes, testing and imaging myself where available.  Lab Results  Component Value Date   WBC 6.2 05/08/2021   HGB 13.6 05/08/2021   HCT 41.0 05/08/2021   MCV 91.4 05/08/2021   PLT 284.0 05/08/2021      Component Value Date/Time   NA 137 05/08/2021 0817   K 4.8 05/08/2021 0817   CL 107 05/08/2021 0817   CO2 22 05/08/2021 0817   GLUCOSE 87 05/08/2021 0817   BUN 10  05/08/2021 0817   CREATININE 0.79 05/08/2021 0817   CREATININE 0.82 01/07/2020 1057   CALCIUM 8.8 05/08/2021 0817   PROT 6.4 05/08/2021 0817   ALBUMIN 4.0 05/08/2021 0817   AST 15 05/08/2021 0817   ALT 17 05/08/2021 0817   ALKPHOS 65 05/08/2021 0817   BILITOT 0.7 05/08/2021 0817   GFRNONAA 95 01/07/2020 1057   GFRAA 110 01/07/2020 1057   Lab Results  Component Value Date   CHOL 143 05/08/2021   HDL 53.00 05/08/2021   LDLCALC 73 05/08/2021  TRIG 83.0 05/08/2021   CHOLHDL 3 05/08/2021   No results found for: "HGBA1C" No results found for: "VITAMINB12" Lab Results  Component Value Date   TSH 1.97 05/08/2021      ASSESSMENT AND PLAN 38 y.o. year old female  has a past medical history of GERD (gastroesophageal reflux disease), Headache, and History of kidney stones. here with:  1.  Migraine headaches  -Continue Emgality 120 mg monthly injection -Continue Maxalt for abortive therapy.  Take 1 tablet at the onset of a migraine and repeat in 2 hours if needed -Advised that if her headaches worsen or she develops new headaches she should let us know -Also advised that she should not consider getting pregnant until she has been off of Emgality for 6 months.  She does voice that she has no plans for pregnancy at this time -Follow-up in 1 year or sooner if needed    Butch Penny, MSN, NP-C 03/21/2023, 9:55 AM [facility] [address] [place], [place] [address] [phone]

## 2023-03-21 NOTE — Patient Instructions (Signed)
Continue Emgality monthly for preventative therapy Continue rizatriptan for abortive therapy.  Take 1 tablet at the onset of a migraine can repeat in 2 hours if needed If your symptoms worsen or you develop new symptoms please let us know.

## 2023-12-08 ENCOUNTER — Telehealth: Payer: Self-pay | Admitting: Adult Health

## 2023-12-08 DIAGNOSIS — G43711 Chronic migraine without aura, intractable, with status migrainosus: Secondary | ICD-10-CM

## 2023-12-08 MED ORDER — EMGALITY 120 MG/ML ~~LOC~~ SOAJ: 120.0000 mg | SUBCUTANEOUS | 3 refills | Status: DC

## 2023-12-08 NOTE — Telephone Encounter (Signed)
 Done, next appt 03/11/24.

## 2023-12-08 NOTE — Telephone Encounter (Signed)
 [facility] called to refill PT medication  Galcanezumab -gnlm (EMGALITY ) 120 MG/ML [facility]   Pharmacy  is   [facility]   [id number], [place], [place] [address] Phone: [phone] Fax [phone]

## 2024-03-11 ENCOUNTER — Telehealth: Payer: No Typology Code available for payment source | Admitting: Adult Health

## 2024-03-11 DIAGNOSIS — G43711 Chronic migraine without aura, intractable, with status migrainosus: Secondary | ICD-10-CM

## 2024-03-11 MED ORDER — RIZATRIPTAN BENZOATE 10 MG PO TBDP: 10.0000 mg | ORAL_TABLET | ORAL | 11 refills | Status: AC | PRN

## 2024-03-11 MED ORDER — EMGALITY 120 MG/ML ~~LOC~~ SOAJ: 120.0000 mg | SUBCUTANEOUS | 3 refills | Status: DC

## 2024-03-11 NOTE — Patient Instructions (Signed)
 Your Plan:  Continue Emgality  for prevention Continue Maxalt  for abortive therapy     Thank you for coming to see us  at [facility]. I hope we have been able to provide you high quality care today.  You may receive a patient satisfaction survey over the next few weeks. We would appreciate your feedback and comments so that we may continue to improve ourselves and the health of our patients.

## 2024-03-11 NOTE — Progress Notes (Signed)
 "    PATIENT: Grace Moore DOB: [birth year]  REASON FOR VISIT: follow up HISTORY FROM: patient  Virtual Visit via Video Note  I connected with Grace Moore on 03/11/2024 at  2:00 PM EST by a video enabled telemedicine application located remotely at [facility] and verified that I am speaking with the correct person using two identifiers who was located at their own home in [place].   I discussed the limitations of evaluation and management by telemedicine and the availability of in person appointments. The patient expressed understanding and agreed to proceed.   PATIENT: Grace Moore DOB: 1985-08-11  REASON FOR VISIT: follow up HISTORY FROM: patient  HISTORY OF PRESENT ILLNESS: Today 03/11/2024:  Grace Moore is a 39 y.o. female with a history of migraine headaches. Returns today for follow-up.  She reports overall she has been doing really well.  Only having approximately 1 migraine a month.  She uses rizatriptan  for abortive therapy when needed.  Overall she is doing really well.  Denies any new medical history.  We discussed pregnancy patient reports no plans to get pregnant.  She returns today for an evaluation.   03/21/23: Grace Moore is a 39 y.o. female with a history of migraines. Returns today for follow-up. Currently on emgality  for provide rizatriptan  for abortive therapy.  She states that the medication seems to be working well.  She only has approximately 2-3 headaches a month and typically rizatriptan  will resolve her headache within an hour.  Overall she is happy with her current treatment plan.  Denies any new issues.  Returns today for an evaluation.   HISTORY 11/06/2022: Ajovy  is not helping, having daily headaches.  Ajovy  stopped working, but she also gets a raised rash that is uncomfortable. We discussed other options like emgality , vyepti. Lating all day. Moderate to severe.  patient says it is throbbing pressure-like headache  with nausea vomiting photophobia and phonophobia with worsening with movement, last all day long. Will try Emgality . Omce Emgality  is doing well, discuss acute (try rizatriptan  and next try Nurtec or Ubrelvy)   Patient complains of symptoms per HPI as well as the following symptoms: none . Pertinent negatives and positives per HPI. All others negative     HPI:  Grace Moore is a 39 y.o. female here as requested by Jodie Lavern CROME, MD for migraines. PMHx gerd, headache, kidney stones, obesity, migraines. I reviewed Epic and Care Everywhere and did not find any significant history of her migraines, last seen by Dr. Lavern in March of this year, she has however been a patient of Dr. Layman Meissner from the headache center, I reviewed his notes, patient described a throbbing pressure-like headache with nausea vomiting photophobia and phonophobia with worsening with movement osmophobia present and dizziness.  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  She has had a migraine disorder since 2011 with progressions in September 2018.  She is also had nerve blocks and trigger point injections,    She is here alone, she is on Zonegran  and it has helped, she has been on it for 3 years, 100mg , some nausea and she wants to try to go off of it or try another medication. The last time she had a migraine was 9 months ago and she alleve and it went away. She is interested in trying to decrease or stop it since migraine  frequency is so little. Pulsating, pounding throbbing, photo and phonophobia. They were severe and daily prior to the zonegran , she had visited the ED, smells would trigger/worsen it, got worse after second child. Had nausea, vomiting. She never tried a triptan, baclofen made her tired.      Reviewed notes, labs and imaging from outside physicians,  which showed: 05/08/2021: cbc,cmp,tsh unremarkable.   A review of  records shows patient has tried the following medications that can be used in migraine management: Zonisamide , tramadol , Phenergan  injections and oral, Compazine  injections, Zofran  injections, Reglan  tabs, mag oxide, mag citrate, Toradol  injections, ibuprofen  tabs, gabapentin , diclofenac  tabs, Decadron  injections, Fioricet, baclofen, Tylenol , additionally from review of records from [facility], she has tried Excedrin, Benadryl , Sudafed, Zyrtec, Flexeril, nerve blocks, trigger point injections, amitriptyline, topamax, blood pressure medications contraindicated .due to hypotention, Aimovig contraindicated due to constipation, ajovy (stopped working)  REVIEW OF SYSTEMS: Out of a complete 14 system review of symptoms, the patient complains only of the following symptoms, and all other reviewed systems are negative.  ALLERGIES: No Known Allergies  HOME MEDICATIONS: Outpatient Medications Prior to Visit  Medication Sig Dispense Refill   esomeprazole  (NEXIUM ) 40 MG capsule TAKE 1 CAPSULE TWICE DAILY BEFORE A MEAL 180 capsule 1   Galcanezumab -gnlm (EMGALITY ) 120 MG/ML SOAJ Inject 120 mg into the skin every 30 (thirty) days. Please use copay card: Rx BIN [id number] : Group: [id number]  ID#: [id number] Expires: 03/04/2023 PCN: PDMI. 1 mL 3   rizatriptan  (MAXALT -MLT) 10 MG disintegrating tablet Take 1 tablet (10 mg total) by mouth as needed for migraine. May repeat in 2 hours if needed 9 tablet 11   No facility-administered medications prior to visit.    PAST MEDICAL HISTORY: Past Medical History:  Diagnosis Date   GERD (gastroesophageal reflux disease)    Headache    History of kidney stones    passed stone, no surgery    PAST SURGICAL HISTORY: Past Surgical History:  Procedure Laterality Date   CESAREAN SECTION  11/2009   [facility]   CESAREAN SECTION WITH BILATERAL TUBAL LIGATION Bilateral 08/27/2014   Procedure: REPEAT  CESAREAN SECTION WITH BILATERAL TUBAL LIGATION ;  Surgeon: Burnard Bowers, MD;  Location: [facility];  Service: Obstetrics;  Laterality: Bilateral;  EDD: 09/03/14   CHOLECYSTECTOMY     CYST EXCISION N/A 02/21/2021   Procedure: EXCISION OF SCLAP SEBACEOUS CYST;  Surgeon: Dasie Leonor CROME, MD;  Location: [facility];  Service: General;  Laterality: N/A;   TUBAL LIGATION  08/27/2014   WISDOM TOOTH EXTRACTION     WRIST SURGERY     x 2 one left and 1 right     FAMILY HISTORY: Family History  Problem Relation Age of Onset   Hypertension Mother    AAA (abdominal aortic aneurysm) Father    Stroke Father    Hypertension Father    Hyperlipidemia Father    Heart attack Father    Breast cancer Maternal Grandmother    Arthritis Maternal Grandmother    Kidney disease Maternal Grandmother    Diabetes Maternal Grandfather    Healthy Daughter    Asthma Son    Migraines Neg Hx     SOCIAL HISTORY: Social History   Socioeconomic History   Marital status: Married    Spouse name: Ludie   Number of children: 2   Years of education: Not on file   Highest education level: Not on file  Occupational History   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   Education : [employer]   Work: Runner, Broadcasting/film/video 2 yrs.   Social Drivers of Health   Tobacco Use: Low Risk (09/24/2023)   Received from [facility]   Patient History    Smoking Tobacco Use: Never    Smokeless Tobacco Use: Never    Passive Exposure: Not on file  Financial Resource Strain: Low Risk (09/21/2023)   Received from  [facility]   Overall Financial Resource Strain (CARDIA)    How hard is it for you to pay for the very basics like food, housing, medical care, and heating?: Not hard at all  Food Insecurity: No Food Insecurity (09/21/2023)   Received from [facility]   Epic    Within the past 12 months, you worried that your food would run out before you got the money to buy more.: Never true    Within the past 12 months, the food you bought just didn't last and you didn't have money to get more.: Never true  Transportation Needs: No Transportation Needs (09/21/2023)   Received from [facility]    In the past 12 months, has lack of transportation kept you from medical appointments or from getting medications?: No    In the past 12 months, has lack of transportation kept you from meetings, work, or from getting things needed for daily living?: No  Physical Activity: Insufficiently Active (09/21/2023)   Received from [facility]   Exercise Vital Sign    On average, how many days per week do you engage in moderate to strenuous exercise (like a brisk walk)?: 3 days    On average, how many minutes do you engage in exercise at this level?: 20 min  Stress: No Stress Concern Present (09/21/2023)   Received from [facility] - Occupational Stress Questionnaire    Do you feel stress - tense, restless, nervous, or anxious, or unable to sleep at night because your mind is troubled all the time - these days?: Not at all  Social Connections: Socially Integrated (09/21/2023)   Received from [facility]   Social Network    How would you rate your social network (family, work, friends)?: Good participation with social networks  Intimate Partner Violence: Not At Risk (09/21/2023)   Received from [facility]   HITS    Over the last 12 months how often did your partner physically hurt you?: Never    Over the last 12 months how often did your partner insult you or  talk down to you?: Never    Over the last 12 months how often did your partner threaten you with physical harm?: Never    Over the last 12 months how often did your partner scream or curse at you?: Never  Depression (PHQ2-9): Low Risk (05/07/2021)   Depression (PHQ2-9)    PHQ-2 Score: 0  Alcohol Screen: Not on file  Housing: Low Risk (09/21/2023)   Received [facility]  In the last 12 months, was there a time when you were not able to pay the mortgage or rent on time?: No    In the past 12 months, how many times have you moved where you were living?: 1    At any time in the past 12 months, were you homeless or living in a shelter (including now)?: No  Utilities: Not At Risk (09/21/2023)   Received from [facility]    In the past 12 months has the electric, gas, oil, or water company threatened to shut off services in your home?: No  Health Literacy: Not on file      PHYSICAL EXAM Generalized: Well developed, in no acute distress   Neurological examination  Mentation: Alert oriented to time, place, history taking. Follows all commands speech and language fluent Cranial nerve II-XII: Facial symmetry noted DIAGNOSTIC DATA (LABS, IMAGING, TESTING) - I reviewed patient records, labs, notes, testing and imaging myself where available.  Lab Results  Component Value Date   WBC 6.2 05/08/2021   HGB 13.6 05/08/2021   HCT 41.0 05/08/2021   MCV 91.4 05/08/2021   PLT 284.0 05/08/2021      Component Value Date/Time   NA 137 05/08/2021 0817   K 4.8 05/08/2021 0817   CL 107 05/08/2021 0817   CO2 22 05/08/2021 0817   GLUCOSE 87 05/08/2021 0817   BUN 10 05/08/2021 0817   CREATININE 0.79 05/08/2021 0817   CREATININE 0.82 01/07/2020 1057   CALCIUM 8.8 05/08/2021 0817   PROT 6.4 05/08/2021 0817   ALBUMIN 4.0 05/08/2021 0817   AST 15 05/08/2021 0817   ALT 17 05/08/2021 0817   ALKPHOS 65 05/08/2021 0817   BILITOT 0.7 05/08/2021 0817   GFRNONAA 95 01/07/2020 1057    GFRAA 110 01/07/2020 1057   Lab Results  Component Value Date   CHOL 143 05/08/2021   HDL 53.00 05/08/2021   LDLCALC 73 05/08/2021   TRIG 83.0 05/08/2021   CHOLHDL 3 05/08/2021    Lab Results  Component Value Date   TSH 1.97 05/08/2021      ASSESSMENT AND PLAN 39 y.o. year old female  has a past medical history of GERD (gastroesophageal reflux disease), Headache, and History of kidney stones. here with:  1.  Migraine headaches  -Continue Emgality  120 mg monthly injection -Continue Maxalt  for abortive therapy.  Take 1 tablet at the onset of a migraine and repeat in 2 hours if needed -Advised that if her headaches worsen or she develops new headaches she should let us  know -Also advised that she should not consider getting pregnant until she has been off of Emgality  for 6 months.  She does voice that she has no plans for pregnancy at this time -Follow-up in 1 year or sooner if needed  Meds ordered this encounter  Medications   Galcanezumab -gnlm (EMGALITY ) 120 MG/ML SOAJ    Sig: Inject 120 mg into the skin every 30 (thirty) days.    Dispense:  3 mL    Refill:  3    .90 day supply    Supervising Provider:   YAN, YIJUN [[id number]]   rizatriptan  (MAXALT -MLT) 10 MG disintegrating tablet    Sig: Take 1 tablet (10 mg total) by mouth as needed for migraine. May repeat in 2 hours if needed    Dispense:  9 tablet    Refill:  11    Supervising Provider:   YAN, YIJUN [[id number]]     Duwaine Russell, MSN, NP-C  03/11/2024, 2:01 PM [facility] [address], [address] [place], [place] [address] [phone]  "

## 2024-04-06 ENCOUNTER — Other Ambulatory Visit: Payer: Self-pay | Admitting: Adult Health

## 2024-04-06 DIAGNOSIS — G43711 Chronic migraine without aura, intractable, with status migrainosus: Secondary | ICD-10-CM

## 2025-03-15 ENCOUNTER — Telehealth: Admitting: Adult Health
